# Patient Record
Sex: Female | Born: 1960 | Race: White | Hispanic: No | Marital: Married | Smoking: Former smoker
Health system: Southern US, Community
[De-identification: ages and names within clinical notes are randomized; demographics above are authoritative.]

## PROBLEM LIST (undated history)

## (undated) DIAGNOSIS — C801 Malignant (primary) neoplasm, unspecified: Secondary | ICD-10-CM

## (undated) DIAGNOSIS — Z973 Presence of spectacles and contact lenses: Secondary | ICD-10-CM

## (undated) DIAGNOSIS — F32A Depression, unspecified: Secondary | ICD-10-CM

## (undated) DIAGNOSIS — E785 Hyperlipidemia, unspecified: Secondary | ICD-10-CM

## (undated) DIAGNOSIS — I10 Essential (primary) hypertension: Secondary | ICD-10-CM

## (undated) DIAGNOSIS — I700 Atherosclerosis of aorta: Secondary | ICD-10-CM

## (undated) DIAGNOSIS — F329 Major depressive disorder, single episode, unspecified: Secondary | ICD-10-CM

## (undated) HISTORY — DX: Essential (primary) hypertension: I10

## (undated) HISTORY — PX: DILATION AND CURETTAGE OF UTERUS: SHX78

## (undated) HISTORY — PX: HAND EXPLORATION: SHX1725

## (undated) HISTORY — DX: Atherosclerosis of aorta: I70.0

## (undated) HISTORY — DX: Hyperlipidemia, unspecified: E78.5

---

## 1999-05-15 ENCOUNTER — Ambulatory Visit (HOSPITAL_COMMUNITY): Admission: RE | Admit: 1999-05-15 | Discharge: 1999-05-15 | Payer: Self-pay | Admitting: *Deleted

## 1999-05-15 ENCOUNTER — Encounter (INDEPENDENT_AMBULATORY_CARE_PROVIDER_SITE_OTHER): Payer: Self-pay

## 1999-09-23 HISTORY — PX: MASTECTOMY: SHX3

## 1999-09-23 HISTORY — PX: BREAST LUMPECTOMY WITH AXILLARY LYMPH NODE BIOPSY: SHX5593

## 1999-09-23 HISTORY — PX: BREAST RECONSTRUCTION: SHX9

## 2000-05-07 ENCOUNTER — Other Ambulatory Visit: Admission: RE | Admit: 2000-05-07 | Discharge: 2000-05-07 | Payer: Self-pay | Admitting: Obstetrics and Gynecology

## 2000-06-04 ENCOUNTER — Encounter: Admission: RE | Admit: 2000-06-04 | Discharge: 2000-06-04 | Payer: Self-pay | Admitting: *Deleted

## 2000-06-04 ENCOUNTER — Other Ambulatory Visit: Admission: RE | Admit: 2000-06-04 | Discharge: 2000-06-04 | Payer: Self-pay | Admitting: *Deleted

## 2000-06-04 ENCOUNTER — Encounter (INDEPENDENT_AMBULATORY_CARE_PROVIDER_SITE_OTHER): Payer: Self-pay | Admitting: *Deleted

## 2000-06-19 ENCOUNTER — Ambulatory Visit (HOSPITAL_BASED_OUTPATIENT_CLINIC_OR_DEPARTMENT_OTHER): Admission: RE | Admit: 2000-06-19 | Discharge: 2000-06-19 | Payer: Self-pay | Admitting: *Deleted

## 2000-06-30 ENCOUNTER — Encounter (INDEPENDENT_AMBULATORY_CARE_PROVIDER_SITE_OTHER): Payer: Self-pay | Admitting: Specialist

## 2000-06-30 ENCOUNTER — Ambulatory Visit (HOSPITAL_BASED_OUTPATIENT_CLINIC_OR_DEPARTMENT_OTHER): Admission: RE | Admit: 2000-06-30 | Discharge: 2000-06-30 | Payer: Self-pay | Admitting: *Deleted

## 2000-07-31 ENCOUNTER — Encounter (INDEPENDENT_AMBULATORY_CARE_PROVIDER_SITE_OTHER): Payer: Self-pay | Admitting: *Deleted

## 2000-07-31 ENCOUNTER — Inpatient Hospital Stay (HOSPITAL_COMMUNITY): Admission: RE | Admit: 2000-07-31 | Discharge: 2000-08-03 | Payer: Self-pay | Admitting: *Deleted

## 2000-09-22 HISTORY — PX: BREAST RECONSTRUCTION: SHX9

## 2000-12-29 ENCOUNTER — Encounter: Admission: RE | Admit: 2000-12-29 | Discharge: 2000-12-29 | Payer: Self-pay | Admitting: *Deleted

## 2000-12-29 ENCOUNTER — Encounter: Payer: Self-pay | Admitting: *Deleted

## 2001-08-04 ENCOUNTER — Other Ambulatory Visit: Admission: RE | Admit: 2001-08-04 | Discharge: 2001-08-04 | Payer: Self-pay | Admitting: Obstetrics and Gynecology

## 2001-09-03 ENCOUNTER — Encounter (INDEPENDENT_AMBULATORY_CARE_PROVIDER_SITE_OTHER): Payer: Self-pay | Admitting: *Deleted

## 2001-09-03 ENCOUNTER — Ambulatory Visit (HOSPITAL_BASED_OUTPATIENT_CLINIC_OR_DEPARTMENT_OTHER): Admission: RE | Admit: 2001-09-03 | Discharge: 2001-09-04 | Payer: Self-pay | Admitting: Plastic Surgery

## 2001-09-22 HISTORY — PX: RECONSTRUCTION / CORRECTION OF NIPPLE / AEROLA: SUR1073

## 2001-12-08 ENCOUNTER — Ambulatory Visit (HOSPITAL_BASED_OUTPATIENT_CLINIC_OR_DEPARTMENT_OTHER): Admission: RE | Admit: 2001-12-08 | Discharge: 2001-12-08 | Payer: Self-pay | Admitting: Plastic Surgery

## 2002-03-15 ENCOUNTER — Encounter: Payer: Self-pay | Admitting: *Deleted

## 2002-03-15 ENCOUNTER — Encounter: Admission: RE | Admit: 2002-03-15 | Discharge: 2002-03-15 | Payer: Self-pay | Admitting: *Deleted

## 2002-09-22 HISTORY — PX: CYST EXCISION: SHX5701

## 2003-01-09 ENCOUNTER — Other Ambulatory Visit: Admission: RE | Admit: 2003-01-09 | Discharge: 2003-01-09 | Payer: Self-pay | Admitting: Obstetrics and Gynecology

## 2003-03-23 ENCOUNTER — Encounter (INDEPENDENT_AMBULATORY_CARE_PROVIDER_SITE_OTHER): Payer: Self-pay | Admitting: Specialist

## 2003-03-23 ENCOUNTER — Ambulatory Visit (HOSPITAL_BASED_OUTPATIENT_CLINIC_OR_DEPARTMENT_OTHER): Admission: RE | Admit: 2003-03-23 | Discharge: 2003-03-23 | Payer: Self-pay | Admitting: Plastic Surgery

## 2003-04-10 ENCOUNTER — Encounter: Payer: Self-pay | Admitting: Oncology

## 2003-04-10 ENCOUNTER — Encounter: Admission: RE | Admit: 2003-04-10 | Discharge: 2003-04-10 | Payer: Self-pay | Admitting: Oncology

## 2003-04-13 ENCOUNTER — Encounter: Admission: RE | Admit: 2003-04-13 | Discharge: 2003-04-13 | Payer: Self-pay | Admitting: Oncology

## 2003-04-13 ENCOUNTER — Encounter: Payer: Self-pay | Admitting: Oncology

## 2004-05-07 ENCOUNTER — Encounter: Admission: RE | Admit: 2004-05-07 | Discharge: 2004-05-07 | Payer: Self-pay | Admitting: Obstetrics and Gynecology

## 2004-05-15 ENCOUNTER — Other Ambulatory Visit: Admission: RE | Admit: 2004-05-15 | Discharge: 2004-05-15 | Payer: Self-pay | Admitting: Obstetrics and Gynecology

## 2005-02-10 ENCOUNTER — Ambulatory Visit: Payer: Self-pay | Admitting: Oncology

## 2005-05-07 ENCOUNTER — Encounter: Admission: RE | Admit: 2005-05-07 | Discharge: 2005-05-07 | Payer: Self-pay | Admitting: Oncology

## 2005-10-21 ENCOUNTER — Other Ambulatory Visit: Admission: RE | Admit: 2005-10-21 | Discharge: 2005-10-21 | Payer: Self-pay | Admitting: Obstetrics and Gynecology

## 2005-10-27 ENCOUNTER — Ambulatory Visit: Payer: Self-pay | Admitting: Oncology

## 2006-07-22 ENCOUNTER — Encounter: Admission: RE | Admit: 2006-07-22 | Discharge: 2006-07-22 | Payer: Self-pay | Admitting: Family Medicine

## 2006-08-24 ENCOUNTER — Encounter: Admission: RE | Admit: 2006-08-24 | Discharge: 2006-08-24 | Payer: Self-pay | Admitting: Obstetrics and Gynecology

## 2007-03-30 ENCOUNTER — Encounter: Admission: RE | Admit: 2007-03-30 | Discharge: 2007-03-30 | Payer: Self-pay | Admitting: Obstetrics and Gynecology

## 2007-07-26 ENCOUNTER — Encounter: Admission: RE | Admit: 2007-07-26 | Discharge: 2007-07-26 | Payer: Self-pay | Admitting: Family Medicine

## 2008-08-28 ENCOUNTER — Encounter: Admission: RE | Admit: 2008-08-28 | Discharge: 2008-08-28 | Payer: Self-pay | Admitting: Obstetrics and Gynecology

## 2008-09-22 HISTORY — PX: BILATERAL SALPINGOOPHORECTOMY: SHX1223

## 2008-09-22 HISTORY — PX: LAPAROSCOPIC VAGINAL HYSTERECTOMY: SUR798

## 2009-07-17 ENCOUNTER — Ambulatory Visit (HOSPITAL_COMMUNITY): Admission: RE | Admit: 2009-07-17 | Discharge: 2009-07-18 | Payer: Self-pay | Admitting: Obstetrics and Gynecology

## 2009-07-17 ENCOUNTER — Encounter (INDEPENDENT_AMBULATORY_CARE_PROVIDER_SITE_OTHER): Payer: Self-pay | Admitting: Obstetrics and Gynecology

## 2009-08-29 ENCOUNTER — Encounter: Admission: RE | Admit: 2009-08-29 | Discharge: 2009-08-29 | Payer: Self-pay | Admitting: Family Medicine

## 2010-01-06 IMAGING — MG MM DIGITAL SCREENING
3 series · 3 of 3 positions shown · non-contrast
Comparison: none

DG SCREEN MAMMOGRAM BILATERAL
Bilateral CC and MLO view(s) were taken.

DIGITAL SCREENING MAMMOGRAM WITH CAD:
There are scattered fibroglandular densities.  Right TRAM flap is present.  No masses or malignant 
type calcifications are identified.  Compared with prior studies.
Images were processed with CAD.

[L CC]
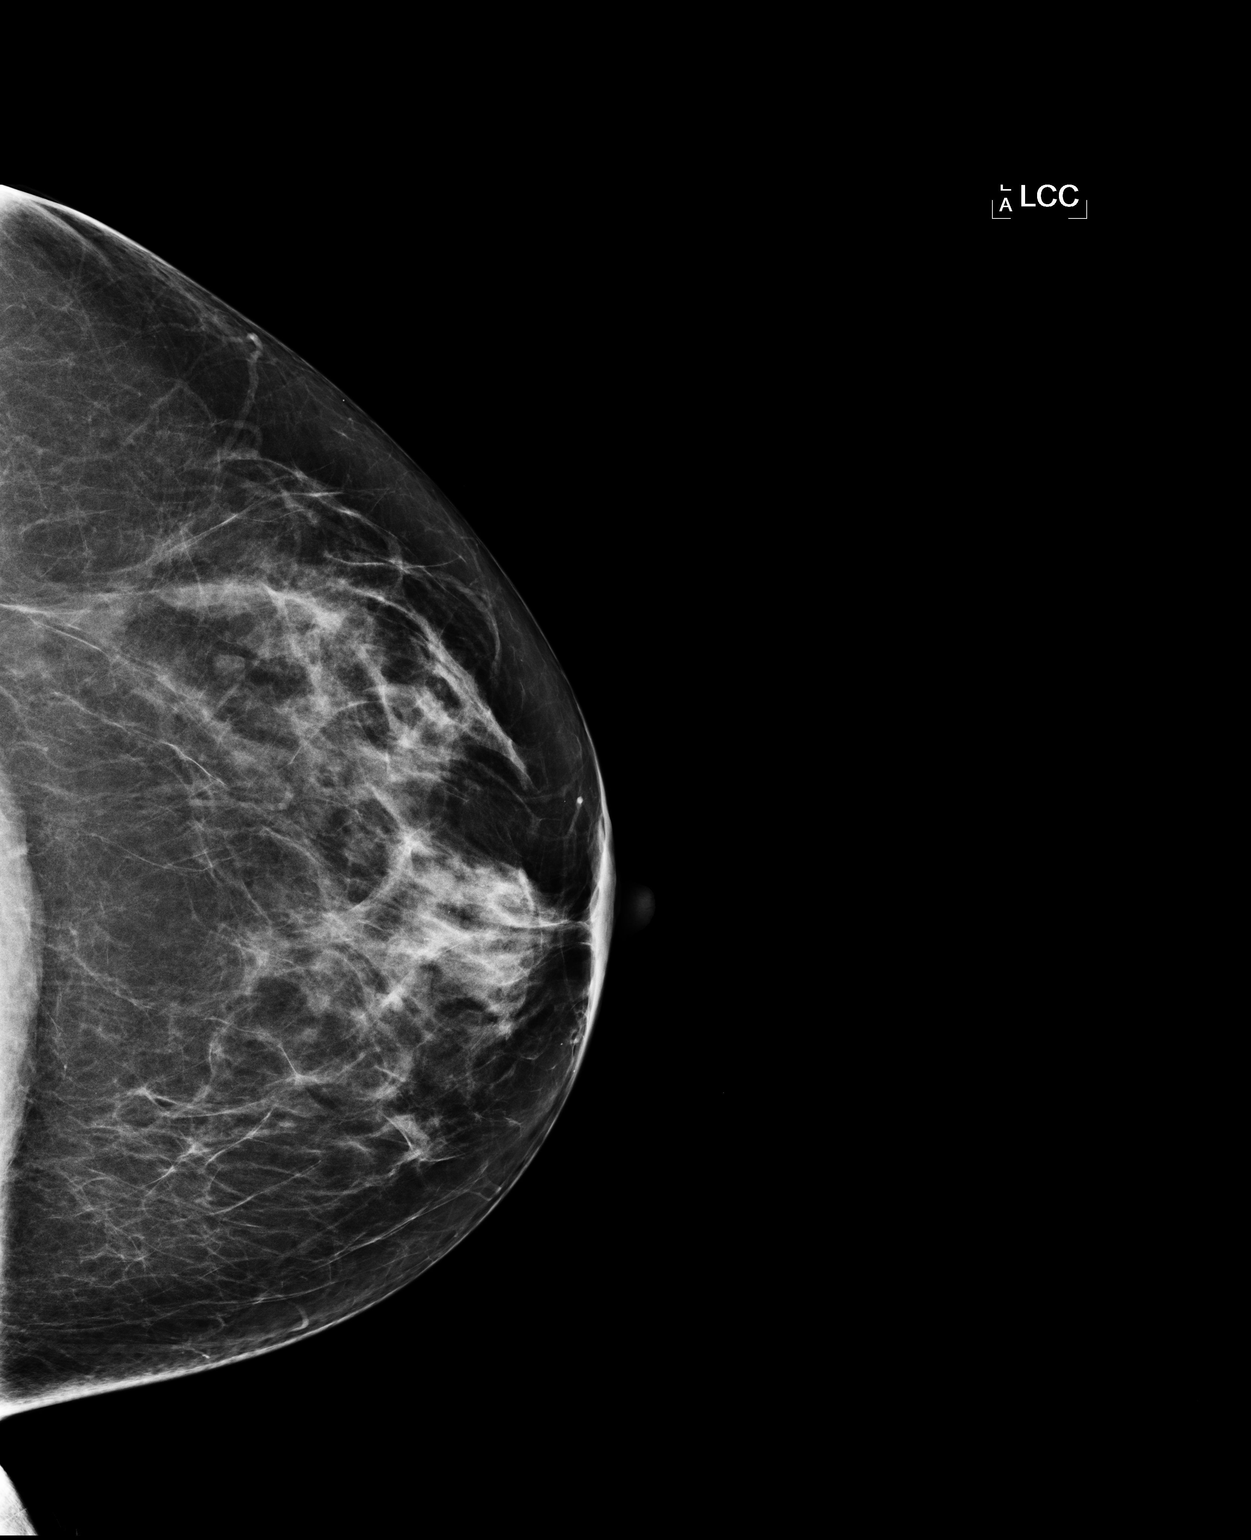

[L MLO]
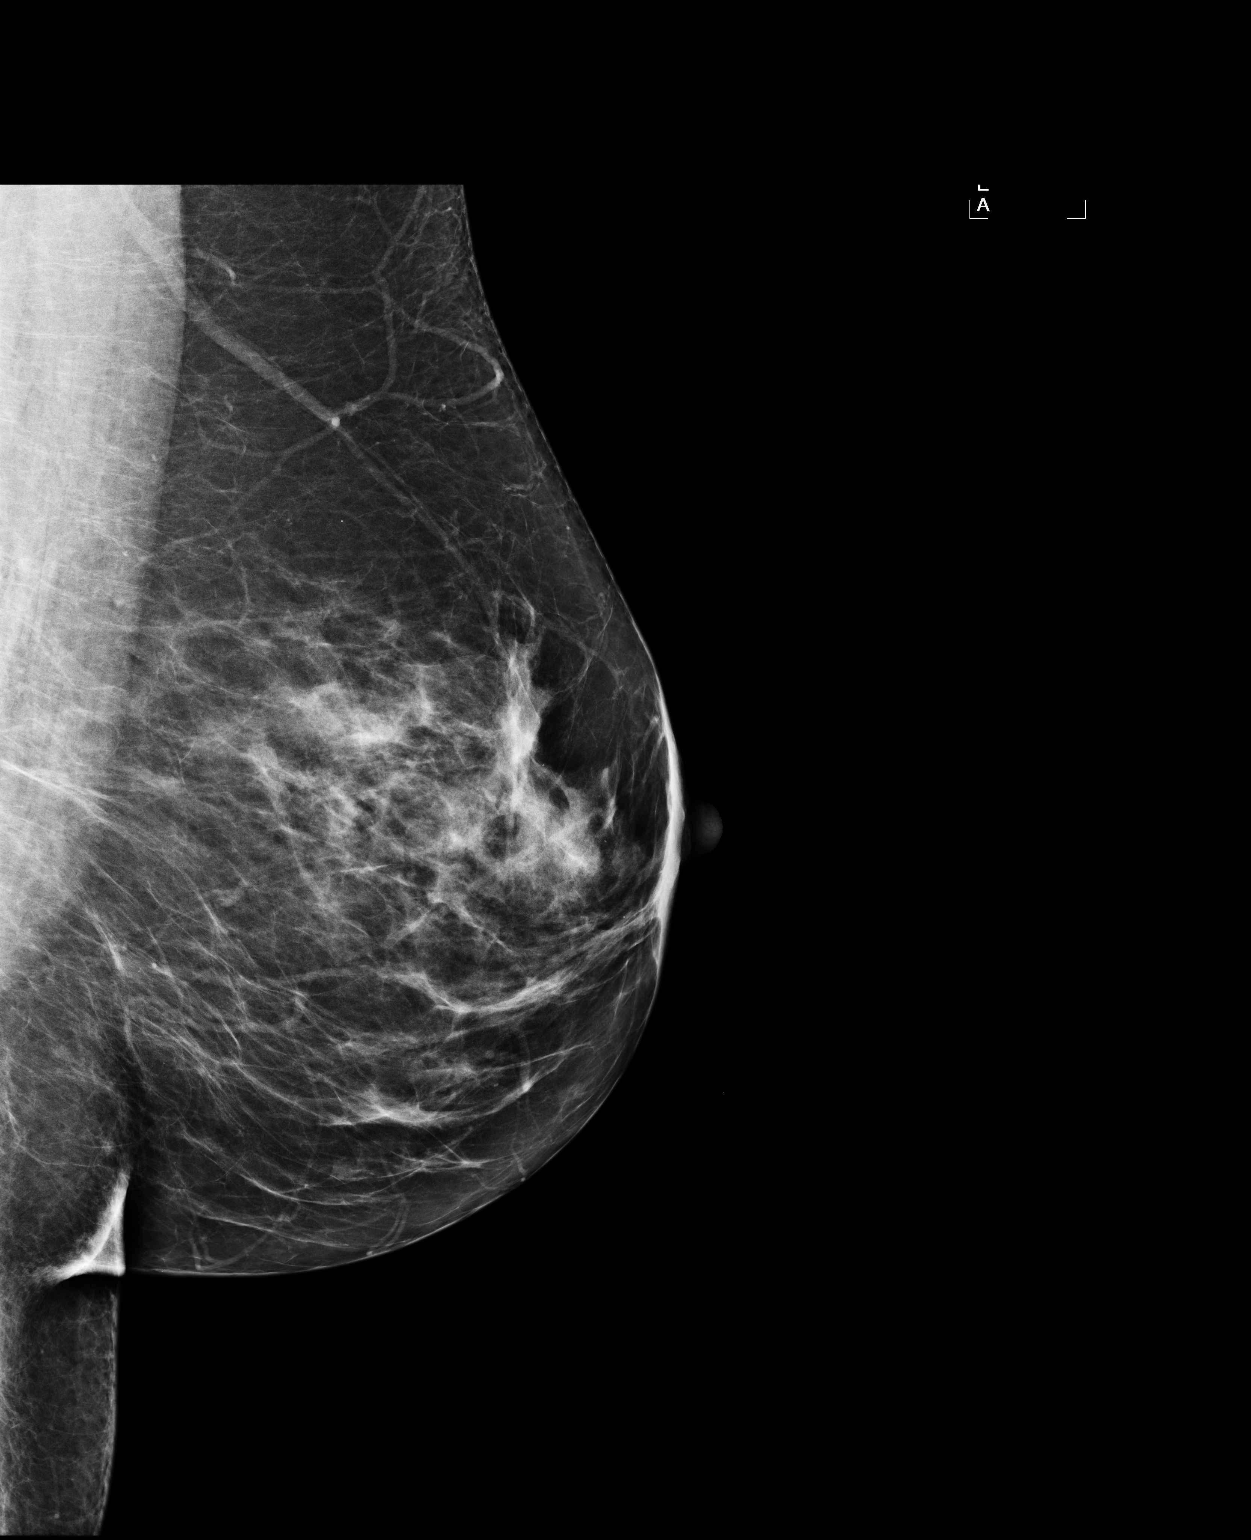

[R MLO]
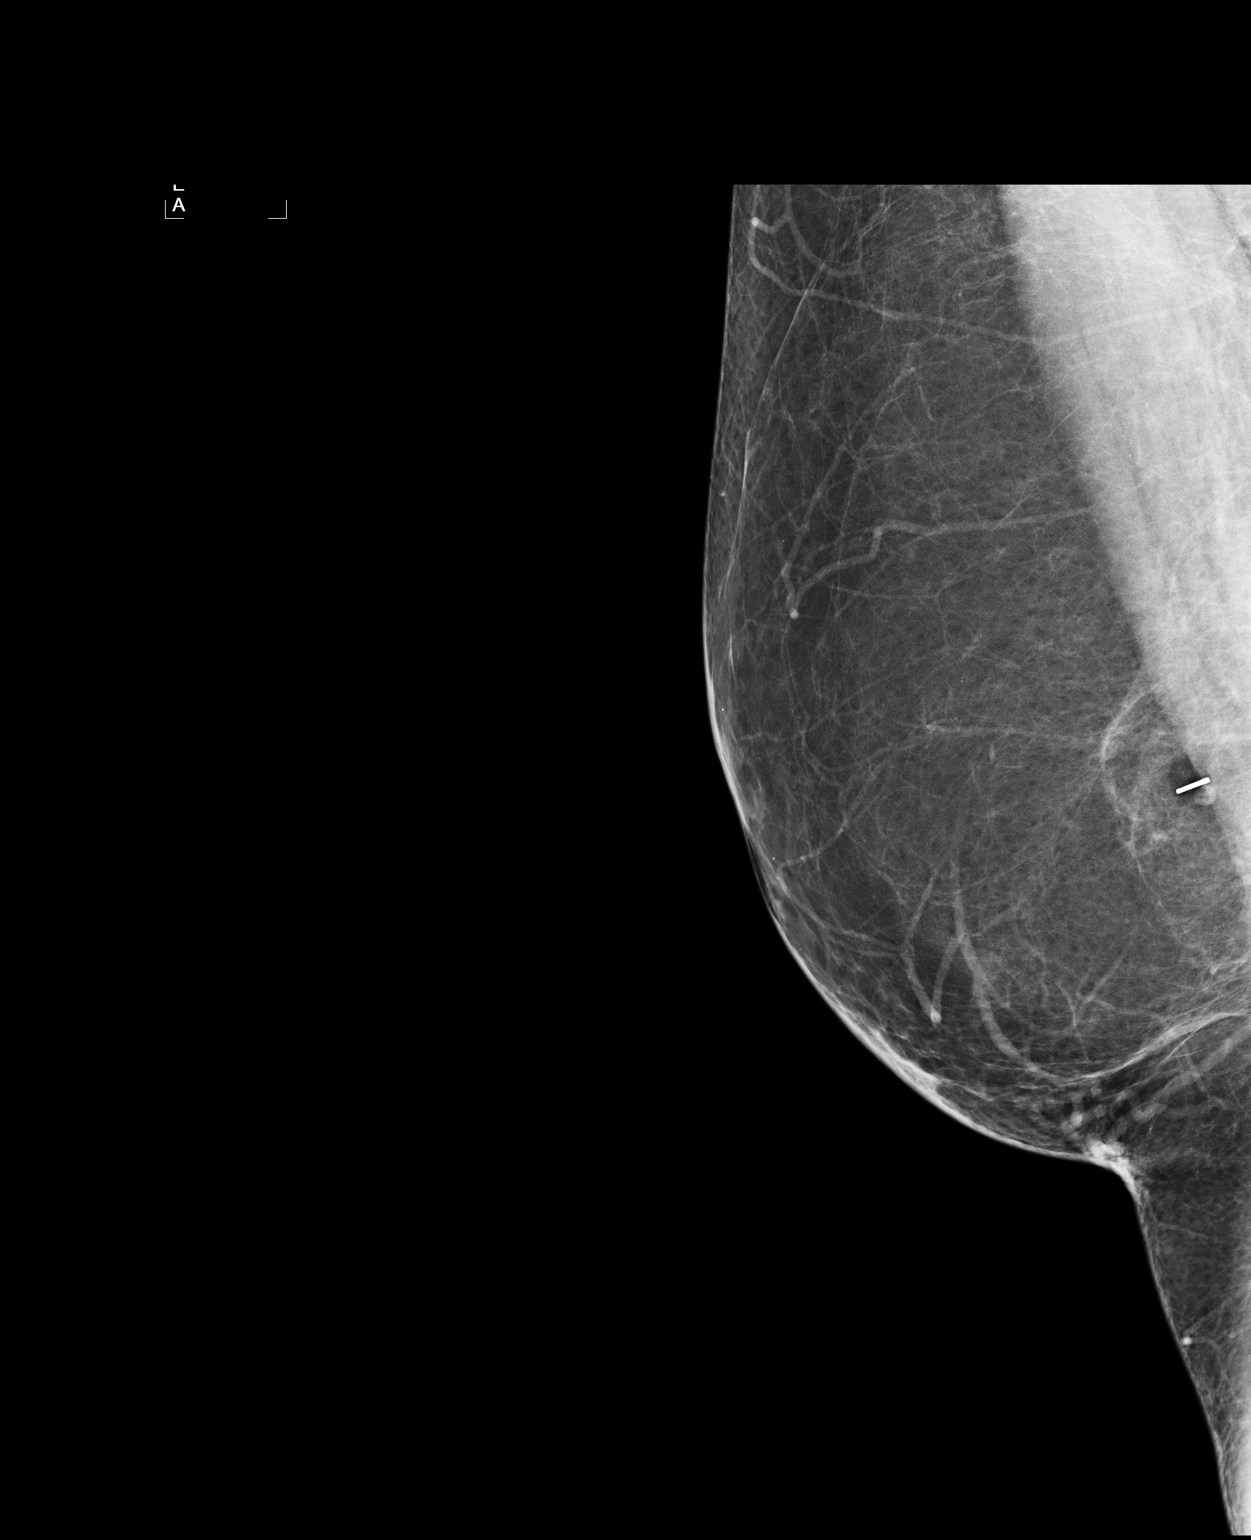

[3 of 3 positions shown; findings below may reference images not displayed]

IMPRESSION: No specific mammographic evidence of malignancy.  Next screening mammogram is recommended in one 
year.

A result letter of this screening mammogram will be mailed directly to the patient.

ASSESSMENT: Negative - BI-RADS 1

Screening mammogram in 1 year.
,

## 2010-09-12 ENCOUNTER — Encounter
Admission: RE | Admit: 2010-09-12 | Discharge: 2010-09-12 | Payer: Self-pay | Source: Home / Self Care | Attending: Obstetrics and Gynecology | Admitting: Obstetrics and Gynecology

## 2010-12-26 LAB — COMPREHENSIVE METABOLIC PANEL
AST: 24 U/L (ref 0–37)
Albumin: 4.3 g/dL (ref 3.5–5.2)
Alkaline Phosphatase: 51 U/L (ref 39–117)
CO2: 28 mEq/L (ref 19–32)
Creatinine, Ser: 0.74 mg/dL (ref 0.4–1.2)
GFR calc non Af Amer: >60 mL/min (ref >60)
Glucose, Bld: 69 mg/dL — ABNORMAL LOW (ref 70–99)
Potassium: 3.9 mEq/L (ref 3.5–5.1)
Total Bilirubin: 0.7 mg/dL (ref 0.3–1.2)

## 2010-12-26 LAB — CBC
HCT: 45.2 % (ref 36.0–46.0)
Hemoglobin: 11.4 g/dL — ABNORMAL LOW (ref 12.0–15.0)
Hemoglobin: 15.2 g/dL — ABNORMAL HIGH (ref 12.0–15.0)
MCHC: 33.6 g/dL (ref 30.0–36.0)
MCV: 93.5 fL (ref 78.0–100.0)
Platelets: 222 10*3/uL (ref 150–400)
Platelets: 270 10*3/uL (ref 150–400)
RBC: 3.61 MIL/uL — ABNORMAL LOW (ref 3.87–5.11)
RBC: 4.84 MIL/uL (ref 3.87–5.11)
RDW: 12.5 % (ref 11.5–15.5)
WBC: 12 10*3/uL — ABNORMAL HIGH (ref 4.0–10.5)

## 2011-02-07 NOTE — Op Note (Signed)
[place]. [facility]  Patient:    Whitney [name], Whitney [name] Visit Number: [id number] MRN: [id number]          Service Type: DSU Location: [facility] Attending Physician:  Loura Halt Ii Dictated by:   Alfredia Ferguson, M.D. Proc. Date: 09/03/01 Admit Date:  09/03/2001                             Operative Report  PREOPERATIVE DIAGNOSES: 1. History of breast cancer, status post right tram flap reconstruction of    right breast. 2. Fat necrosis of right tram flap. 3. Asymmetric breasts with left native breast larger than reconstructed    breast.  POSTOPERATIVE DIAGNOSES: 1. History of breast cancer, status post right tram flap reconstruction of    right breast. 2. Fat necrosis of right tram flap. 3. Asymmetric breasts with left native breast larger than reconstructed    breast.  OPERATION: 1. Revision of right reconstructed breast by removal of fat necrosis and    rotation of tram flap in a medial direction. 2. Left breast reduction, 102 g removed.  SURGEON:  Alfredia Ferguson, M.D.  ANESTHESIA:  General endotracheal anesthesia.  INDICATIONS FOR PROCEDURE:  This is a 51 year old woman who had right breast cancer and reconstruction with a tram flap approximately a year ago.  She developed medial tram flap fat necrosis which is approximately 12 cm in length and approximately 3 cm in width.  She wishes to have the right reconstructed breast revised.  My plan is to remove this large area of medial fat necrosis. In addition, I will elevate the tram completely and re-rotate the tram flap to a more medial direction to fill the space left by the excision of the fat necrosis.  The patient also has a left native breast which is significantly larger than the right breast.  This is also very totic.  Because of the asymmetry, the plan is to do a small reduction and elevate the left breast to a higher position to match the tram more closely.  The patient was  counseled regarding the potential risks of surgery, including damage to the tram flap due to interruption of its blood supply.  The patient also understands the risks of bleeding, infection, asymmetry, necrosis of the nipple areolar complex on the left side for the reduction, over reduction of the left side, under reduction of the left side, unsightly scarring, continued asymmetry, recurrent fat necrosis, and overall dissatisfaction with the procedures.  In spite of these and other risks discussed, the patient wishes to proceed with the operation.  DESCRIPTION OF PROCEDURE:  With the patient in a standing position and prior to being taken to the operating room, skin marks were placed on the left side outlining a wife skin pattern for inferior pedicle technique reduction mammoplasty.  In addition, the dimensions of the fat necrosis were marked on the right side.  The patient was taken to the operating room where she was given general laryngeal mask anesthesia.  The patients chest was prepped with Betadine and draped with sterile drapes.  Attention was first directed to the right reconstructed breast.  An incision was made in the original tram flap incision completely around the skin paddle of the tram flap.  The incision was extended medialward through the original mastectomy incision.  Superior breast flaps were elevated along with inferior breast flaps at the plane between the tram flap and the breast flaps.  Dissection was carried deeply and medially, skirting along the medial border of the fat necrosis.  Dissection was deepened until reaching the pectoralis fascia along the complete medial border of the tram flap.  The tram was now elevated off of the pectoralis fascia from a medial to lateral direction.  I encountered the rectus muscle approximately 5 to 6 cm into the dissection.  At that point, I deepened the dissection to get under the rectus muscle.  Once I had dissected the tram  flap off of the chest wall approximately halfway from medial to lateral, I opted to continue my dissection from lateral to medial.  The lateral limits of the tram were identified, and dissected off of the lateral chest wall and continued to dissect it off the pectoralis muscle until meeting my previous dissection. The rectus muscle was protected throughout the entire dissection, and the color of the tram flap remained pink and healthy.  The tram flap now easily rotated in a more medial direction in an adequate amount of distance to fill the space that would be left after I dissected the fat necrosis.  The fat necrosis was now dissected away from the soft tram tissue using electrocautery.  The fat necrosis once completely dissected, was passed off for pathology.  Hemostasis was accomplished using electrocautery along the edge of the medial tram flap.  The mastectomy pocket which has essentially been recreated by my dissection was copiously irrigated with saline irrigation, and hemostasis accomplished using electrocautery.  A 10 mm Blake drain was placed through a separate stab incision and placed in the lateral axillary gutter.  The superior medial and medial border of the tram flap was now rotated into the more medial position of the mastectomy pocket, and fixed into position using multiple 3-0 Vicryl sutures to suspend it from its new desired position.  This filled the space very nicely, and with no tension whatsoever left behind from the fat necrosis excision.  Temporary closure of the mastectomy incision was carried out using staples.  I was pleasantly surprised to find that I was able to close the mastectomy incision primarily, thus allowing me to completely de-epithelialize the skin paddle of the tram flap.  Temporary staples were placed in the mastectomy incision to ensure that there was no tension.  Attention was now directed to the left breast where a reduction mammoplasty was  commenced.  An 8 cm wide inferiorly based pedicle was marked around the central portion of the breast, and within the confines  of this pedicle the nipple areolar complex remained.  A 42 mm diameter circle was drawn around the nipple.  The mark around the nipple was now incised, and the dimensions of the pedicle were also incised, and the pedicle was de-epithelialized with the exception of the nipple areolar complex.  The pedicle was dissected away from the surrounding breast tissue, cutting on the bias, both in a medial, superior, and lateral direction to ensure adequate connections to the chest wall for vascular integrity.  Once the pedicle had been dissected away from the surrounding breast tissue, the remainder of the previously marked skin marks were incised.  The medial breast flap was incised and deepened until reaching the chest wall.  The medial breast flap was dissected off the pectoralis flap for several centimeters.  The central breast flap was now incised at the location of the new nipple areolar complex, and the central breast flap was also dissected, cutting on the bias towards the chest wall in a  superior direction.  The lateral breast flap was likewise incised, and cut down to the chest wall thinning the breast flap laterally to the thickness of approximately 3 to 3.5 cm.  Once the medial, superior, and lateral breast flaps had been elevated, this left the excess dermaglandular tissue between the pedicle and the breast flaps still connected to the chest wall.  This excess tissue was now dissected off of the chest wall from a medial to lateral direction, and then passed off for pathology.  A total of 102 g were removed from the breast.  Hemostasis was accomplished throughout the dissection using electrocautery.  The breast reduction wound was copiously irrigated with warm saline irrigation, and again inspected for hemostasis. Once hemostasis had been assured, closure was  commenced.  The inferior corner of the vertical limb of the medial and lateral breast flaps were united to the central portion of the inframammary crease incision using 2-0 Vicryl suture. The superior corner of the vertical limb was united in a similar suture.  The nipple areolar complex was placed in its new position and fixed in position using multiple interrupted 3-0 Vicryl sutures for the dermis.  The inframammary crease incision was closed using multiple 3-0 Vicryl sutures for the dermis.  The nipple areolar complex and vertical limb of the wound were closed using a running 4-0 Monocryl subcuticular suture.  The reduced breast was cleansed, dried, and Steri-Strips were applied.  Attention was redirected to the right breast.  There were no excess tension noted on my temporary closure with staples.  All staples were removed, and the skin from the tram flap was now de-epithelialized.  The pocket of the tram side was now copiously irrigated with warm saline irrigation, and inspected once more for hemostasis. Once assured, the transverse original mastectomy incision was closed using multiple interrupted 3-0 Vicryl sutures in the dermis.  There was a lateral dog ear which I excised.  After excision, hemostasis was accomplished in the cut edge, and the lateral corner was then closed.  The right side was cleansed, dried, and Steri-Strips were applied to the incision.  Asymmetry was much improved.  The right tram was nice and soft.  Bulky dressings were placed around both left and right side, and a circumferential wrap with a 6 inch Ace was placed.  The patient tolerated the procedure well, with an estimated blood loss of approximately 100 cc.  She was awakened, extubated, and transported to the recovery room in satisfactory condition. Dictated by:   Alfredia Ferguson, M.D. Attending Physician:  Loura Halt Ii DD:  09/03/01 TD:  09/03/01 Job: [id number]

## 2011-02-07 NOTE — Op Note (Signed)
[place]. [facility]  Patient:    Whitney [name], Whitney [name]                      MRN: [id number] Proc. Date: 06/30/00 Adm. Date:  [id number] Attending:  Stephenie Acres                           Operative Report  PREOPERATIVE DIAGNOSIS:  Right breast cancer.  POSTOPERATIVE DIAGNOSIS:  Right breast cancer.  OPERATION: 1. Right partial mastectomy. 2. Blue dye injection. 3. Lymphatic mapping. 4. Sentinel lymph node biopsy.  SURGEON:  Catalina Lunger, M.D.  ANESTHESIA:  General  DESCRIPTION OF PROCEDURE:  The patient was taken to the operating room and placed in the supine position.  After adequate anesthesia was induced, the right breast and axilla were prepped and draped in the normal sterile fashion. Then 5 cc of Lymphazurin blue dye were injected intradermally around the nipple and around the previous biopsy site.  This was massaged for five minutes.  Using the neoprobe, an area of high activity was identified in the right axilla.  Small incision was made over this, dissected down to subcutaneous fat until a hot blue lymph node was identified.  This was excised and sent for pathologic evaluation.  Touch prep was negative for malignant cells.  The skin was closed with subcuticular 4-0 Monocryl. I then turned my attention to the previous lumpectomy site.  An elliptical incision was made around the previous scar, dissected in all directions down to the pectoralis fascia excising a very large segment of breast tissue.  The margins were marked and was sent for pathologic evaluation. Skin was closed with subcuticular 4-0 Monocryl.  Steri-Strips and sterile dressing were applied. The patient tolerated the procedure well and went to PACU in good condition. DD:  06/30/00 TD:  07/01/00 Job: [id number]

## 2011-02-07 NOTE — Op Note (Signed)
[place]. [facility]  Patient:    Whitney [name], Whitney [name] Visit Number: [id number] MRN: [id number]          Service Type: DSU Location: [facility] Attending Physician:  Loura Halt Ii Dictated by:   Alfredia Ferguson, M.D. Proc. Date: 12/08/01 Admit Date:  12/08/2001                             Operative Report  PREOPERATIVE DIAGNOSIS:  Right breast cancer.  Acquired absence of right breast.  Status post right breast reconstruction with TRAM flap.  POSTOPERATIVE DIAGNOSIS:  Right breast cancer.  Acquired absence of right breast.  Status post right breast reconstruction with TRAM flap.  PROCEDURE:  Right nipple reconstruction using double opposing tab technique.  SURGEON:  Alfredia Ferguson, M.D.  ANESTHESIA:  1% Xylocaine plain.  INDICATIONS:  This is a 50 year old woman who is status post right mastectomy for breast cancer.  She was reconstructed with a buried TRAM flap.  She now comes in for nipple reconstruction.  The plan is to do a double opposing tab technique utilizing the transverse mastectomy scar for reconstruction.  DESCRIPTION OF PROCEDURE:  Location of the nipple was marked with the patient in a sitting position.  The patient was placed in the supine position and a 42 mm diameter circle was drawn around the location of the marked nipple.  Within the confines of this circle, a lazy S-shaped skin mark was placed with the long axis of the S using the transverse scar of the mastectomy incision.  At either end of the S, a skin mark was placed outlining an half ellipse to be used for elevating a tab of full-thickness skin.  The area was prepped and draped in the usual sterile fashion.  Plain local anesthesia was infiltrated into the area.  The tab of full-thickness skin to be elevated was at the 6 oclock and 9 oclock position.  Both of these tabs were elevated to the end of the S skin mark both superiorly and inferiorly.  The S was now incised  down to the level of the subcutaneous tissue.  A full-thickness skin flap was elevated, first from the medial side with the inferior skin flap still attached and then the superior limb of the S was elevated from lateral to medial with the full-thickness skin stab connected to the flap.  Two tabs were elevated toward the middle and then folded together as two hands in prayer in order to create a cylinder.  This cylinder was fixed together with 4-0 chromic suture.  The tabs of skin were at the base of this created cylinder and was wrapped around the base and fixed to the cylinder using interrupted 4-0 chromic suture.  The donor site was closed using interrupted 4-0 PDS.  This was placed in the dermis.  The skin edges were closed using a running 4-0 chromic suture.  The patient tolerated the procedure well.  Vascularity of the nipple appeared to be good.  The area was cleansed and a bulky dressing was placed to protect the nipple.  The patient was discharged home in satisfactory condition. Dictated by:   Alfredia Ferguson, M.D. Attending Physician:  Loura Halt Ii DD:  12/08/01 TD:  12/09/01 Job: [id number]

## 2011-02-07 NOTE — Op Note (Signed)
   NAME:  Whitney [name], Whitney [name] NO.:  [id number]   MEDICAL RECORD NO.:  [id number]                   PATIENT TYPE:  AMB   LOCATION:                                       FACILITY:  [facility]   PHYSICIAN:  Alfredia Ferguson, M.D.               DATE OF BIRTH:  04-08-1961   DATE OF PROCEDURE:  03/23/2003  DATE OF DISCHARGE:                                 OPERATIVE REPORT   PREOPERATIVE DIAGNOSIS:  One cm sebaceous cyst, upper outer quadrant, right  reconstructed breast just below axilla.   POSTOPERATIVE DIAGNOSIS:  One cm sebaceous cyst, upper outer quadrant, right  reconstructed breast just below axilla.   OPERATION PERFORMED:  Excision of sebaceous cyst, upper outer quadrant,  right reconstructed breast.   SURGEON:  Alfredia Ferguson, M.D.   ANESTHESIA:  2% Xylocaine with 1:100,000 epinephrine.   INDICATIONS FOR PROCEDURE:  The patient is a 50 year old woman who  approximately six months ago had an infected sebaceous cyst which required  incision and drainage.  She now has a residual cyst.  She wished to have it  excised in order to prevent further infection.  She understands that she  will be trading what she has for a permanent potentially unsightly scar.  In  spite of that she wishes to proceed with the operation.   DESCRIPTION OF PROCEDURE:  Skin marks were placed in an elliptical fashion  around the cyst.  Local anesthesia was infiltrated and the area was prepped  with Betadine and draped with sterile drapes. After waiting approximately  five minutes, an elliptical excision of skin including the underlying cyst  was carried out.  The specimen was passed off to pathology. The wound edges  were undermined for a distance of several mm in all directions.  The wound  was closed by approximating the dermis using interrupted 4-0 Monocryl  suture.  Steri-Strips were applied to the skin edges.  A light dressing was  applied and the patient was discharged home in  satisfactory condition.                                               Alfredia Ferguson, M.D.    WBB/MEDQ  D:  03/23/2003  T:  03/23/2003  Job:  [id number]

## 2011-02-07 NOTE — Op Note (Signed)
[place]. [facility]  Patient:    Whitney [name], Whitney [name]                      MRN: [id number] Adm. Date:  [id number] Attending:  Stephenie Acres                           Operative Report  ______  fashioned.  Small wires were trimmed back.  Elliptical incision was made around the superior wire initially, dissected down all around the wire to a depth of about 3 cm.  It appeared at this point that I could take both specimens en block and, therefore, the incision was extended to the inferior wire as well.  All tissues surround both wire tips was removed completely en block.  Wires were marked.  Adequate hemostasis was ensured, and skin was closed with subcuticular 4-0 Monocryl.  Steri-Strips and sterile dressings were applied.  The patient tolerated the procedure well and went to PACU in good condition. DD:  06/19/00 TD:  06/19/00 Job: [id number]

## 2011-02-07 NOTE — Op Note (Signed)
[place]. [facility]  Patient:    Whitney [name], Whitney [name]                      MRN: [id number] Proc. Date: 07/31/00 Adm. Date:  [id number] Attending:  Stephenie Acres CC:         Catalina Lunger, M.D.   Operative Report  PREOPERATIVE DIAGNOSES: 1. Right breast carcinoma. 2. Acquired absence right breast.  POSTOPERATIVE DIAGNOSES: 1. Right breast carcinoma. 2. Acquired absence right breast.  OPERATION PERFORMED:  Immediate breast reconstruction utilizing a right-sided transverse rectus abdominis myocutaneous flap.  SURGEON:  Alfredia Ferguson, M.D.  FIRST ASSISTANT:  Janet Berlin. Dan Humphreys, M.D.  ANESTHESIA:  General endotracheal anesthesia.  INDICATIONS:  This is a 50 year old female with biopsy-proven right breast carcinoma.  She has opted to undergo a right total mastectomy.  She wishes to then to proceed with immediate breast reconstruction with a TRAM flap.  She understands the potential risk of the surgery, including partial or complete loss of flap due to vascular compromise, asymmetry, unsightly scarring, infection, bleeding requiring transfusion, abdominal wound healing difficulties, including hernias, bulges or weakness, unsightly scarring in the abdomen, prolonged serous drainage and overall dissatisfaction with the results.  Despite of these and other risks discussed, the patient wished to proceed with the surgery.  DESCRIPTION OF PROCEDURE:  On the day prior to surgery, skin marks were placed outlining the dimensions of the skin paddle of the TRAM flap.  The patient was taken to the operating room, where Dr. Luan Pulling completed a total mastectomy.  Following the mastectomy, I was summoned to the operating room. The mastectomy defect was first inspected and hemostasis was ensured.  A 10 mm Blake drain was placed in the right axillary gutter and brought out through a separate stab incision.  A circular incision was now made around the  umbilicus and the umbilical stalk was dissected away from the abdominal flap leaving a healthy cuff of fatty tissue to ensure vascular integrity.  The superior portion of the skin paddle of the TRAM flap was now made and deepened until reaching the anterior abdominal wall fascia.  The abdominal flap was elevated to the costal margins bilaterally and the xiphoid in the midline.  A tunnel was continued on the medial right side of the costal margins until connecting with the inferomedial portion of the mastectomy dissection.  Tunnel was approximately 8 cm in width.  Hemostasis was accomplished using electrocautery.   The lower skin paddle incision was made and deepened until reaching the anterior abdominal wall fascia.  The left corner of the skin paddle was elevated off the anterior abdominal wall fascia until crossing 1 cm to the right of the linea alba.  The right corner of the skin paddle was now elevated until reaching approximately 3 cm medial to the lateral border of the rectus fascia.  The lateral rectus perforators were left intact.  Two parallel anterior rectus fascia incisions were made beginning at the costal margins on the right side and carrying the two parallel incisions inferiorly until reaching the skin paddles connection to the anterior rectus fascia on the right side.  The incisions were approximately 2.5 cm apart.  The medial incision was continued inferiorly skirting along the medial connection of the skin paddle to the anterior rectus fascia, then turning laterally up the inferior connection of the skin paddles connection.  The lateral rectus incision was continued inferiorly skirting along the lateral connection of the skin  paddle to the anterior rectus fascia until connecting with my previously made incision inferiorly.  The anterior rectus fascia was now dissected off the anterior portion of the muscle from the costal margins down to below the arcuate line.  The  central rectus strip was left intact.  The deep portion of the rectus muscle was now dissected out of its anatomic bed using bipolar dissection.  The deep inferior epigastric vessels were now visualized.  The rectus muscle was divided at the level of the arcuate line.  The mastectomy pocket was again inspected for hemostasis and irrigated once more.  The deep inferior epigastric artery was clipped with hemoclips and divided between the clips.  The two veins were also clipped and divided.  The zone four of the skin paddle (left lateral one-third) was now excised.  The skin paddle along with its connected rectus muscle was tunneled up into the mastectomy defect and temporarily stapled in position.  The abdominal wound was copiously irrigated with saline irrigation and inspected for hemostasis.  Once hemostasis had been ensured, the anterior rectus fascia was closed using multiple interrupted, buried figure-of-eight 0 Prolene sutures.  The rectus fascia closed without any tension.  Patient was noted preoperatively to have umbilical hernia.  I opted not to repair the umbilical hernia in order to not risk jeopardizing the vascular integrity of the umbilicus.  A 6 inch x 6 inch piece of Marlex mesh was placed over the rectus closure and trimmed to be approximately 4 inches in width and about 8 inches in height.  The mesh was oriented where the two points of the square were up and superior and inferior over the rectus fascia closure.  This mesh was fixed in position using a running 2-0 Prolene suture along the periphery.  An opening was made in the central Marlex mesh and the umbilicus was brought through this opening.  The abdominal wound was again copiously irrigated with saline irrigation and inspected for hemostasis.  Patient was now placed in a semi-Fowlers position with the head elevated approximately 30 degrees and the knees flexed.  The midline of the abdominal wound was closed using  interrupted 2-0 Vicryl suture for the dermis.  A new opening was made for the umbilicus and the umbilical  stalk was brought through this new opening and fixed in position by uniting the dermis of the umbilicus to the dermis of the abdominal flap using multiple interrupted 3-0 Vicryl sutures.  The remainder of the abdominal wound was closed using multiple interrupted 2-0 Vicryl sutures for the dermis.  The lower abdomen was cleansed, dried and Steri-Strips were applied to the abdominal wound and the umbilicus.  Attention was directed to the TRAM flap. The right lateral corner of the skin paddle was removed, excising approximately 3-4 cm of the right lateral corner.  There was excellent bleeding coming from the right lateral corner and no further excision of this tissue was needed.  I opted to remove several cm more of the left side of the skin paddle, which was medial on the chest wall in order to ensure better circulation.  There was some venous congestion present medially.  Once I debrided a bit more and I was secure about the amount of bleeding, I opted to begin insetting the flap.  The flap was pushed up into the superior limits of the mastectomy defect and the superior breast flap was allowed to overlap the skin paddle of the TRAM.  This area was marked and the skin  that was beneath the superior breast flap was deepithelialized.  The superior edges of the deepithelialized skin paddle was suspended from the superior limits of the mastectomy dissection using interrupted 3-0 Vicryl sutures.  Superior cut edge of the TRAM skin paddle was united with the cut edge of the superior breast flap using interrupted 3-0 Vicryl sutures for the dermis and a running 3-0 Vicryl subcuticular.  Inferiorly, the breast flap was allowed to overlap the skin paddle.  An additional amount of skin was deepithelialized in the inferior portion of the skin paddle.  I was able to get the medial 3-4 cm of the  mastectomy incision closed primarily with the medial TRAM flap buried beneath the closure.  Following further deepithelialization of the inferior TRAM skin paddle, the lower breast flap was closed to the skin paddle.  Vicryl 3-0 interrupted were used for the dermis.  The skin paddle had excellent capillary refill.  The chest was cleansed, dried and Steri-Strips were applied to the TRAM flap skin closure.  Two Blake drains were placed in the abdominal wound and brought out through separate stab incisions prior to closure of the abdominal wound.  Light dressings were applied to the abdomen and breast.  The patient was awakened, extubated and transported to the recovery room in satisfactory condition. DD:  07/31/00 TD:  08/01/00 Job: [id number]

## 2011-02-07 NOTE — Op Note (Signed)
[place]. [facility]  Patient:    Whitney [name], Whitney [name]                      MRN: [id number] Proc. Date: 07/31/00 Adm. Date:  [id number] Attending:  Stephenie Acres                           Operative Report  PREOPERATIVE DIAGNOSES: 1. Extensive ductal carcinoma in situ. 2. 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  POSTOPERATIVE DIAGNOSES: 1. Extensive ductal carcinoma in situ. 2. 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  PROCEDURE:  Right total mastectomy.  SURGEON:  Catalina Lunger, M.D.  ASSISTANT:  Adolph Pollack, M.D.  ANESTHESIA:  General.  DESCRIPTION OF PROCEDURE:  The patient was taken to the operating room and placed in the supine position.  After general anesthesia was induced by endotracheal tube, the bilateral chest, abdomen, and groins were prepped and draped in the normal sterile fashion.  Using an elliptical incision around the nipple/areolar complex of the right breast, extending out and around the previous lateral incision, I incised through the skin and dermis.  The superior flap was fashioned up to the clavicle laterally to the latissimus dorsi, inferiorly to the inframammary fold, and medially to the sternum.  All tissue was then removed en bloc off of the pectoralis fascia.  The specimen was sent to pathology.  Adequate hemostasis was assured.  At this point  Dr. _______ began a TRAM reconstruction. DD:  07/31/00 TD:  07/31/00 Job: [id number]

## 2011-08-06 ENCOUNTER — Other Ambulatory Visit: Payer: Self-pay | Admitting: Obstetrics and Gynecology

## 2011-08-06 DIAGNOSIS — Z1231 Encounter for screening mammogram for malignant neoplasm of breast: Secondary | ICD-10-CM

## 2011-09-29 ENCOUNTER — Ambulatory Visit
Admission: RE | Admit: 2011-09-29 | Discharge: 2011-09-29 | Disposition: A | Discharge status: Home / Self Care | Payer: BC Managed Care – PPO | Source: Ambulatory Visit | Attending: Obstetrics and Gynecology | Admitting: Obstetrics and Gynecology

## 2011-09-29 DIAGNOSIS — Z1231 Encounter for screening mammogram for malignant neoplasm of breast: Secondary | ICD-10-CM

## 2012-10-18 ENCOUNTER — Other Ambulatory Visit: Payer: Self-pay | Admitting: Obstetrics and Gynecology

## 2012-10-18 DIAGNOSIS — Z1231 Encounter for screening mammogram for malignant neoplasm of breast: Secondary | ICD-10-CM

## 2012-11-15 ENCOUNTER — Ambulatory Visit
Admission: RE | Admit: 2012-11-15 | Discharge: 2012-11-15 | Disposition: A | Discharge status: Home / Self Care | Payer: BC Managed Care – PPO | Source: Ambulatory Visit | Attending: Obstetrics and Gynecology | Admitting: Obstetrics and Gynecology

## 2012-11-15 DIAGNOSIS — Z1231 Encounter for screening mammogram for malignant neoplasm of breast: Secondary | ICD-10-CM

## 2013-10-31 ENCOUNTER — Encounter (HOSPITAL_BASED_OUTPATIENT_CLINIC_OR_DEPARTMENT_OTHER): Payer: Self-pay | Admitting: *Deleted

## 2013-10-31 ENCOUNTER — Other Ambulatory Visit: Payer: Self-pay | Admitting: Orthopedic Surgery

## 2013-10-31 NOTE — Progress Notes (Signed)
No labs needed

## 2013-11-02 ENCOUNTER — Ambulatory Visit (HOSPITAL_BASED_OUTPATIENT_CLINIC_OR_DEPARTMENT_OTHER)
Admission: RE | Admit: 2013-11-02 | Discharge: 2013-11-02 | Disposition: A | Discharge status: Home / Self Care | Payer: Managed Care, Other (non HMO) | Source: Ambulatory Visit | Attending: Orthopedic Surgery | Admitting: Orthopedic Surgery

## 2013-11-02 ENCOUNTER — Encounter (HOSPITAL_BASED_OUTPATIENT_CLINIC_OR_DEPARTMENT_OTHER)
Admission: RE | Disposition: A | Discharge status: Home / Self Care | Payer: Self-pay | Source: Ambulatory Visit | Attending: Orthopedic Surgery

## 2013-11-02 ENCOUNTER — Encounter (HOSPITAL_BASED_OUTPATIENT_CLINIC_OR_DEPARTMENT_OTHER): Payer: Managed Care, Other (non HMO) | Admitting: Anesthesiology

## 2013-11-02 ENCOUNTER — Encounter (HOSPITAL_BASED_OUTPATIENT_CLINIC_OR_DEPARTMENT_OTHER): Payer: Self-pay | Admitting: Orthopedic Surgery

## 2013-11-02 ENCOUNTER — Ambulatory Visit (HOSPITAL_BASED_OUTPATIENT_CLINIC_OR_DEPARTMENT_OTHER): Payer: Managed Care, Other (non HMO) | Admitting: Anesthesiology

## 2013-11-02 DIAGNOSIS — Z853 Personal history of malignant neoplasm of breast: Secondary | ICD-10-CM | POA: Insufficient documentation

## 2013-11-02 DIAGNOSIS — F3289 Other specified depressive episodes: Secondary | ICD-10-CM | POA: Insufficient documentation

## 2013-11-02 DIAGNOSIS — F329 Major depressive disorder, single episode, unspecified: Secondary | ICD-10-CM | POA: Insufficient documentation

## 2013-11-02 DIAGNOSIS — Z9010 Acquired absence of unspecified breast and nipple: Secondary | ICD-10-CM | POA: Insufficient documentation

## 2013-11-02 DIAGNOSIS — Z923 Personal history of irradiation: Secondary | ICD-10-CM | POA: Insufficient documentation

## 2013-11-02 DIAGNOSIS — F17200 Nicotine dependence, unspecified, uncomplicated: Secondary | ICD-10-CM | POA: Insufficient documentation

## 2013-11-02 DIAGNOSIS — Y9339 Activity, other involving climbing, rappelling and jumping off: Secondary | ICD-10-CM | POA: Insufficient documentation

## 2013-11-02 DIAGNOSIS — S63659A Sprain of metacarpophalangeal joint of unspecified finger, initial encounter: Secondary | ICD-10-CM | POA: Insufficient documentation

## 2013-11-02 DIAGNOSIS — X58XXXA Exposure to other specified factors, initial encounter: Secondary | ICD-10-CM | POA: Insufficient documentation

## 2013-11-02 HISTORY — DX: Presence of spectacles and contact lenses: Z97.3

## 2013-11-02 HISTORY — DX: Depression, unspecified: F32.A

## 2013-11-02 HISTORY — PX: LIGAMENT REPAIR: SHX5444

## 2013-11-02 HISTORY — DX: Major depressive disorder, single episode, unspecified: F32.9

## 2013-11-02 HISTORY — DX: Malignant (primary) neoplasm, unspecified: C80.1

## 2013-11-02 LAB — POCT HEMOGLOBIN-HEMACUE: HEMOGLOBIN: 15.1 g/dL — AB (ref 12.0–15.0)

## 2013-11-02 SURGERY — REPAIR, LIGAMENT
Anesthesia: Monitor Anesthesia Care | Site: Thumb | Laterality: Right

## 2013-11-02 MED ORDER — LIDOCAINE HCL (CARDIAC) 20 MG/ML IV SOLN
INTRAVENOUS | Status: DC | PRN
  Administered 2013-11-02: 50 mg via INTRAVENOUS

## 2013-11-02 MED ORDER — ONDANSETRON HCL 4 MG/2ML IJ SOLN
INTRAMUSCULAR | Status: DC | PRN
  Administered 2013-11-02: 4 mg via INTRAVENOUS

## 2013-11-02 MED ORDER — CHLORHEXIDINE GLUCONATE 4 % EX LIQD
60.0000 mL | Freq: Once | CUTANEOUS | Status: DC
Start: 2013-11-02 — End: 2013-11-02

## 2013-11-02 MED ORDER — HYDROMORPHONE HCL PF 1 MG/ML IJ SOLN: 0.2500 mg | INTRAMUSCULAR | Status: DC | PRN

## 2013-11-02 MED ORDER — FENTANYL CITRATE 0.05 MG/ML IJ SOLN
INTRAMUSCULAR | Status: AC
  Filled 2013-11-02: qty 4

## 2013-11-02 MED ORDER — LACTATED RINGERS IV SOLN
INTRAVENOUS | Status: DC
  Administered 2013-11-02 (×2): via INTRAVENOUS

## 2013-11-02 MED ORDER — HYDROCODONE-ACETAMINOPHEN 5-325 MG PO TABS
1.0000 | ORAL_TABLET | Freq: Four times a day (QID) | ORAL | Status: AC | PRN
Start: 2013-11-02 — End: ?

## 2013-11-02 MED ORDER — PROPOFOL INFUSION 10 MG/ML OPTIME
INTRAVENOUS | Status: DC | PRN
  Administered 2013-11-02: 100 ug/kg/min via INTRAVENOUS

## 2013-11-02 MED ORDER — CEFAZOLIN SODIUM-DEXTROSE 2-3 GM-% IV SOLR: 2.0000 g | INTRAVENOUS | Status: DC

## 2013-11-02 MED ORDER — MIDAZOLAM HCL 2 MG/2ML IJ SOLN
INTRAMUSCULAR | Status: AC
  Filled 2013-11-02: qty 2

## 2013-11-02 MED ORDER — OXYCODONE HCL 5 MG PO TABS: 5.0000 mg | ORAL_TABLET | Freq: Once | ORAL | Status: DC | PRN

## 2013-11-02 MED ORDER — FENTANYL CITRATE 0.05 MG/ML IJ SOLN
INTRAMUSCULAR | Status: AC
  Filled 2013-11-02: qty 2

## 2013-11-02 MED ORDER — OXYCODONE HCL 5 MG/5ML PO SOLN: 5.0000 mg | Freq: Once | ORAL | Status: DC | PRN

## 2013-11-02 MED ORDER — ONDANSETRON HCL 4 MG/2ML IJ SOLN: 4.0000 mg | Freq: Once | INTRAMUSCULAR | Status: DC | PRN

## 2013-11-02 MED ORDER — CEFAZOLIN SODIUM-DEXTROSE 2-3 GM-% IV SOLR
INTRAVENOUS | Status: AC
  Filled 2013-11-02: qty 50

## 2013-11-02 MED ORDER — FENTANYL CITRATE 0.05 MG/ML IJ SOLN
50.0000 ug | INTRAMUSCULAR | Status: DC | PRN
  Administered 2013-11-02: 100 ug via INTRAVENOUS

## 2013-11-02 MED ORDER — MIDAZOLAM HCL 2 MG/2ML IJ SOLN
1.0000 mg | INTRAMUSCULAR | Status: DC | PRN
  Administered 2013-11-02: 2 mg via INTRAVENOUS

## 2013-11-02 MED ORDER — BUPIVACAINE-EPINEPHRINE PF 0.5-1:200000 % IJ SOLN
INTRAMUSCULAR | Status: DC | PRN
  Administered 2013-11-02: 25 mL via PERINEURAL

## 2013-11-02 SURGICAL SUPPLY — 73 items
ANCH SUT 3-0 MN 1 LD NDL (Anchor) ×1 IMPLANT
ANCHOR JUGGERKNOT 1.0 3-0 NLD (Anchor) ×2 IMPLANT
BLADE MINI RND TIP GREEN BEAV (BLADE) ×3 IMPLANT
BLADE SURG 15 STRL LF DISP TIS (BLADE) ×1 IMPLANT
BLADE SURG 15 STRL SS (BLADE) ×3
BNDG CMPR 9X4 STRL LF SNTH (GAUZE/BANDAGES/DRESSINGS) ×1
BNDG COHESIVE 3X5 TAN STRL LF (GAUZE/BANDAGES/DRESSINGS) ×3 IMPLANT
BNDG ESMARK 4X9 LF (GAUZE/BANDAGES/DRESSINGS) ×3 IMPLANT
BNDG GAUZE ELAST 4 BULKY (GAUZE/BANDAGES/DRESSINGS) ×3 IMPLANT
CATH IV 18G (IV SOLUTION) IMPLANT
CHLORAPREP W/TINT 26ML (MISCELLANEOUS) ×3 IMPLANT
CORDS BIPOLAR (ELECTRODE) ×3 IMPLANT
COVER MAYO STAND STRL (DRAPES) ×3 IMPLANT
COVER TABLE BACK 60X90 (DRAPES) ×3 IMPLANT
CUFF TOURNIQUET SINGLE 18IN (TOURNIQUET CUFF) ×2 IMPLANT
DECANTER SPIKE VIAL GLASS SM (MISCELLANEOUS) IMPLANT
DRAPE EXTREMITY T 121X128X90 (DRAPE) ×3 IMPLANT
DRAPE OEC MINIVIEW 54X84 (DRAPES) ×2 IMPLANT
DRAPE SURG 17X23 STRL (DRAPES) ×3 IMPLANT
GAUZE XEROFORM 1X8 LF (GAUZE/BANDAGES/DRESSINGS) ×3 IMPLANT
GLOVE BIOGEL PI IND STRL 7.0 (GLOVE) IMPLANT
GLOVE BIOGEL PI IND STRL 8.5 (GLOVE) ×1 IMPLANT
GLOVE BIOGEL PI INDICATOR 7.0 (GLOVE) ×4
GLOVE BIOGEL PI INDICATOR 8.5 (GLOVE) ×2
GLOVE ECLIPSE 6.5 STRL STRAW (GLOVE) ×4 IMPLANT
GLOVE EXAM NITRILE LRG STRL (GLOVE) ×2 IMPLANT
GLOVE SURG ORTHO 8.0 STRL STRW (GLOVE) ×3 IMPLANT
GOWN STRL REUS W/ TWL LRG LVL3 (GOWN DISPOSABLE) ×1 IMPLANT
GOWN STRL REUS W/TWL LRG LVL3 (GOWN DISPOSABLE) ×6
GOWN STRL REUS W/TWL XL LVL3 (GOWN DISPOSABLE) ×3 IMPLANT
NDL ADDISON D1/2 CIR (NEEDLE) IMPLANT
NDL KEITH (NEEDLE) IMPLANT
NDL KEITH SZ10 STRAIGHT (NEEDLE) IMPLANT
NEEDLE 27GAX1X1/2 (NEEDLE) IMPLANT
NEEDLE ADDISON D1/2 CIR (NEEDLE) IMPLANT
NEEDLE FISTULA 1/2 CIRCLE (NEEDLE) IMPLANT
NEEDLE KEITH (NEEDLE) IMPLANT
NEEDLE KEITH SZ10 STRAIGHT (NEEDLE) IMPLANT
NS IRRIG 1000ML POUR BTL (IV SOLUTION) ×3 IMPLANT
PACK BASIN DAY SURGERY FS (CUSTOM PROCEDURE TRAY) ×3 IMPLANT
PAD CAST 3X4 CTTN HI CHSV (CAST SUPPLIES) ×1 IMPLANT
PADDING CAST ABS 4INX4YD NS (CAST SUPPLIES)
PADDING CAST ABS COTTON 4X4 ST (CAST SUPPLIES) ×1 IMPLANT
PADDING CAST COTTON 3X4 STRL (CAST SUPPLIES) ×3
PASSER SUT SWANSON 36MM LOOP (INSTRUMENTS) IMPLANT
SET EXT MALE ROTATING LL 32IN (MISCELLANEOUS) IMPLANT
SET IV EXT TUBING FEMALE 31 (MISCELLANEOUS) IMPLANT
SLEEVE SCD COMPRESS KNEE MED (MISCELLANEOUS) ×3 IMPLANT
SLING ARM MED ADULT FOAM STRAP (SOFTGOODS) ×4 IMPLANT
SPLINT PLASTER CAST XFAST 3X15 (CAST SUPPLIES) IMPLANT
SPLINT PLASTER XTRA FASTSET 3X (CAST SUPPLIES)
SPONGE GAUZE 4X4 12PLY (GAUZE/BANDAGES/DRESSINGS) ×3 IMPLANT
STOCKINETTE 4X48 STRL (DRAPES) ×3 IMPLANT
SUT ETHIBOND 3-0 V-5 (SUTURE) IMPLANT
SUT FIBERWIRE 2-0 18 17.9 3/8 (SUTURE)
SUT FIBERWIRE 3-0 18 TAPR NDL (SUTURE)
SUT MERSILENE 2.0 SH NDLE (SUTURE) IMPLANT
SUT MERSILENE 4 0 P 3 (SUTURE) ×2 IMPLANT
SUT MON AB 3-0 SH 27 (SUTURE)
SUT MON AB 3-0 SH27 (SUTURE) IMPLANT
SUT SILK 4 0 PS 2 (SUTURE) IMPLANT
SUT STEEL 3 0 (SUTURE) IMPLANT
SUT STEEL 4 0 (SUTURE) IMPLANT
SUT STEEL 4 0 V 26 (SUTURE) IMPLANT
SUT VIC AB 4-0 P2 18 (SUTURE) IMPLANT
SUT VICRYL 4-0 PS2 18IN ABS (SUTURE) IMPLANT
SUT VICRYL RAPIDE 4/0 PS 2 (SUTURE) ×3 IMPLANT
SUTURE FIBERWR 2-0 18 17.9 3/8 (SUTURE) IMPLANT
SUTURE FIBERWR 3-0 18 TAPR NDL (SUTURE) IMPLANT
SYR BULB 3OZ (MISCELLANEOUS) ×3 IMPLANT
SYR CONTROL 10ML LL (SYRINGE) IMPLANT
TOWEL OR 17X24 6PK STRL BLUE (TOWEL DISPOSABLE) ×6 IMPLANT
UNDERPAD 30X30 INCONTINENT (UNDERPADS AND DIAPERS) ×3 IMPLANT

## 2013-11-02 NOTE — H&P (Signed)
Whitney [name] is a 53 year old right hand dominant female who comes in complaining of pain in her right thumb. She suffered a zip line injury on 09-17-13. She did not seek medical attention. She complains of pain at the MCP joint with any gripping or pinching. She has no prior history of injury. She states that she caught her thumb at the brake and this hit her on the radial side of her thumb at the MCP joint. She has had pain in that area since that time. She obtained a splint on her own and wore it for approximately 4 days. She states that this caused some relief but increased pain on removal. No history of diabetes, thyroid problems, arthritis or gout. She complains of a constant, moderate, sharp and aching type pain with a feeling of swelling and weakness. Activity makes it worse especially gripping.   PAST MEDICAL HISTORY: She has no known drug allergies. She is on Prozac. She has had multiple breast surgeries, lumpectomy, mastectomy, and reconstruction between 2001 and 2002.   FAMILY H ISTORY: Negative.  SOCIAL HISTORY: She smokes half PPD is advised to quit and the reasons behind this. She drinks socially. 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   REVIEW OF SYSTEMS: Positive for breast cancer and glasses, otherwise negative for 14 points.  Whitney [name] is an 53 y.o. female.   Chief Complaint: radial collateral ligament avulsion MCP rt thumb HPI: see above  Past Medical History  Diagnosis Date  . Cancer     breast-rt  . Depression   . Wears glasses     Past Surgical History  Procedure Laterality Date  . Breast lumpectomy with axillary lymph node biopsy  2001    right  . Breast reconstruction  2001    right trans flap with mastectomy  . Mastectomy  2001    right-followed by reconstruction  . Reconstruction / correction of nipple / aerola  2003  . Breast reconstruction  2002    revision rt flap  . Cyst excision  2004    rt axilla  .  Laparoscopic vaginal hysterectomy  2010  . Bilateral salpingoophorectomy  2010    with hyst  . Hand exploration      age 40-injury-repair trama-rt  . Dilation and curettage of uterus      History reviewed. No pertinent family history. Social History:  reports that she has been smoking.  She does not have any smokeless tobacco history on file. She reports that she drinks alcohol. She reports that she does not use illicit drugs.  Allergies: No Known Allergies  No prescriptions prior to admission    No results found for this or any previous visit (from the past 48 hour(s)).  No results found.   Pertinent items are noted in HPI.  Height 5\' 1"  (1.549 m), weight 160 lb (72.576 kg).  General appearance: alert, cooperative and appears stated age Head: Normocephalic, without obvious abnormality Neck: no JVD Resp: clear to auscultation bilaterally Cardio: regular rate and rhythm, S1, S2 normal, no murmur, click, rub or gallop GI: soft, non-tender; bowel sounds normal; no masses,  no organomegaly Extremities: extremities normal, atraumatic, no cyanosis or edema Pulses: 2+ and symmetric Skin: Skin color, texture, turgor normal. No rashes or lesions Neurologic: Grossly normal Incision/Wound: na  Assessment/Plan X-rays reveal a fractured base. She is now 6 weeks following injury. We would recommend reconstruction of this with removal of the fragment and repair of the collateral ligament  radial side to the proximal phalanx. The pre, peri and post op course are discussed along with risks and complications.  She is aware there is no guarantee with surgery, possibility of infection, recurrence, injury to arteries, nerves and tendons, incomplete relief of symptoms and dystrophy.  She is advised of the splinting that is necessary. This will be scheduled as an outpatient for reconstruction radial collateral ligament MCP joint right thumb under regional anesthesia.  Derica Leiber R 11/02/2013, 10:07  AM

## 2013-11-02 NOTE — Brief Op Note (Signed)
11/02/2013  1:36 PM  PATIENT:  Whitney [name]  53 y.o. female  PRE-OPERATIVE DIAGNOSIS:  RUPTURED RADIAL COLLATERAL LIGAMENT METACARPAL PHALANGEAL RIGHT THUMB  POST-OPERATIVE DIAGNOSIS:  RUPTURED RADIAL COLLATERAL LIGAMENT METACARPAL PHALANGEAL RIGHT THUMB  PROCEDURE:  Procedure(s): REPAIR RADIAL COLLATERAL LIGAMENT METACARPAL PHALANGEAL RIGHT THUMB (Right)  SURGEON:  Surgeon(s) and Role:    * Wynonia Sours, MD - Primary  PHYSICIAN ASSISTANT:   ASSISTANTS: none   ANESTHESIA:   regional and general  EBL:  Total I/O In: 1200 [I.V.:1200] Out: -   BLOOD ADMINISTERED:none  DRAINS: none   LOCAL MEDICATIONS USED:  NONE  SPECIMEN:  No Specimen  DISPOSITION OF SPECIMEN:  N/A  COUNTS:  YES  TOURNIQUET:  * Missing tourniquet times found for documented tourniquets in log:  [id number] *  DICTATION: .Other Dictation: Dictation Number [id number]  PLAN OF CARE: Discharge to home after PACU  PATIENT DISPOSITION:  PACU - hemodynamically stable.

## 2013-11-02 NOTE — Op Note (Signed)
Dictation Number [id number]

## 2013-11-02 NOTE — Transfer of Care (Signed)
Immediate Anesthesia Transfer of Care Note  Patient: Whitney [name] Seen  Procedure(s) Performed: Procedure(s): REPAIR RADIAL COLLATERAL LIGAMENT METACARPAL PHALANGEAL RIGHT THUMB (Right)  Patient Location: PACU  Anesthesia Type:MAC combined with regional for post-op pain  Level of Consciousness: awake, alert , oriented and patient cooperative  Airway & Oxygen Therapy: Patient Spontanous Breathing and Patient connected to face mask oxygen  Post-op Assessment: Report given to PACU RN and Post -op Vital signs reviewed and stable  Post vital signs: Reviewed and stable  Complications: No apparent anesthesia complications

## 2013-11-02 NOTE — Progress Notes (Signed)
Assisted Dr. Crews with right, ultrasound guided, interscalene  block. Side rails up, monitors on throughout procedure. See vital signs in flow sheet. Tolerated Procedure well. 

## 2013-11-02 NOTE — Anesthesia Postprocedure Evaluation (Signed)
  Anesthesia Post-op Note  Patient: Whitney [name] Seen  Procedure(s) Performed: Procedure(s): REPAIR RADIAL COLLATERAL LIGAMENT METACARPAL PHALANGEAL RIGHT THUMB (Right)  Patient Location: PACU  Anesthesia Type:GA combined with regional for post-op pain  Level of Consciousness: awake, alert  and oriented  Airway and Oxygen Therapy: Patient Spontanous Breathing  Post-op Pain: none  Post-op Assessment: Post-op Vital signs reviewed  Post-op Vital Signs: Reviewed  Complications: No apparent anesthesia complications

## 2013-11-02 NOTE — Anesthesia Procedure Notes (Signed)
Anesthesia Regional Block:  Supraclavicular block  Pre-Anesthetic Checklist: ,, timeout performed, Correct Patient, Correct Site, Correct Laterality, Correct Procedure, Correct Position, site marked, Risks and benefits discussed,  Surgical consent,  Pre-op evaluation,  At surgeon's request and post-op pain management  Laterality: Right and Upper  Prep: chloraprep       Needles:  Injection technique: Single-shot  Needle Type: Echogenic Stimulator Needle     Needle Length: 5cm 5 cm Needle Gauge: 21 and 21 G    Additional Needles:  Procedures: ultrasound guided (picture in chart) Supraclavicular block Narrative:  Start time: 11/02/2013 11:43 AM End time: 11/02/2013 11:48 AM Injection made incrementally with aspirations every 5 mL.  Performed by: Personally  Anesthesiologist: Lorrene Reid

## 2013-11-02 NOTE — Anesthesia Preprocedure Evaluation (Signed)
Anesthesia Evaluation  Patient identified by MRN, date of birth, ID band Patient awake    Reviewed: Allergy & Precautions, H&P , NPO status , Patient's Chart, lab work & pertinent test results  Airway Mallampati: I TM Distance: >3 FB Neck ROM: Full    Dental  (+) Teeth Intact, Dental Advisory Given   Pulmonary Current Smoker,  breath sounds clear to auscultation        Cardiovascular Rhythm:Regular Rate:Normal     Neuro/Psych    GI/Hepatic   Endo/Other    Renal/GU      Musculoskeletal   Abdominal   Peds  Hematology   Anesthesia Other Findings   Reproductive/Obstetrics                           Anesthesia Physical Anesthesia Plan  ASA: I  Anesthesia Plan: MAC and General   Post-op Pain Management:    Induction: Intravenous  Airway Management Planned: Simple Face Mask and LMA  Additional Equipment:   Intra-op Plan:   Post-operative Plan: Extubation in OR  Informed Consent: I have reviewed the patients History and Physical, chart, labs and discussed the procedure including the risks, benefits and alternatives for the proposed anesthesia with the patient or authorized representative who has indicated his/her understanding and acceptance.   Dental advisory given  Plan Discussed with: CRNA, Anesthesiologist and Surgeon  Anesthesia Plan Comments: (Will try to do under MAC with SCB.)        Anesthesia Quick Evaluation

## 2013-11-02 NOTE — Discharge Instructions (Addendum)
°  Post Anesthesia Home Care Instructions ° °Activity: °Get plenty of rest for the remainder of the day. A responsible adult should stay with you for 24 hours following the procedure.  °For the next 24 hours, DO NOT: °-Drive a car °-Operate machinery °-Drink alcoholic beverages °-Take any medication unless instructed by your physician °-Make any legal decisions or sign important papers. ° °Meals: °Start with liquid foods such as gelatin or soup. Progress to regular foods as tolerated. Avoid greasy, spicy, heavy foods. If nausea and/or vomiting occur, drink only clear liquids until the nausea and/or vomiting subsides. Call your physician if vomiting continues. ° °Special Instructions/Symptoms: °Your throat may feel dry or sore from the anesthesia or the breathing tube placed in your throat during surgery. If this causes discomfort, gargle with warm salt water. The discomfort should disappear within 24 hours. ° ° ° ° °Regional Anesthesia Blocks ° °1. Numbness or the inability to move the "blocked" extremity may last from 3-48 hours after placement. The length of time depends on the medication injected and your individual response to the medication. If the numbness is not going away after 48 hours, call your surgeon. ° °2. The extremity that is blocked will need to be protected until the numbness is gone and the  Strength has returned. Because you cannot feel it, you will need to take extra care to avoid injury. Because it may be weak, you may have difficulty moving it or using it. You may not know what position it is in without looking at it while the block is in effect. ° °3. For blocks in the legs and feet, returning to weight bearing and walking needs to be done carefully. You will need to wait until the numbness is entirely gone and the strength has returned. You should be able to move your leg and foot normally before you try and bear weight or walk. You will need someone to be with you when you first try to  ensure you do not fall and possibly risk injury. ° °4. Bruising and tenderness at the needle site are common side effects and will resolve in a few days. ° °5. Persistent numbness or new problems with movement should be communicated to the surgeon or the  Surgery Center ([phone])/ [facility] ([phone]). ° ° ° ° °Hand Center Instructions °Hand Surgery ° °Wound Care: °Keep your hand elevated above the level of your heart.  Do not allow it to dangle by your side.  Keep the dressing dry and do not remove it unless your doctor advises you to do so.  He will usually change it at the time of your post-op visit.  Moving your fingers is advised to stimulate circulation but will depend on the site of your surgery.  If you have a splint applied, your doctor will advise you regarding movement. ° °Activity: °Do not drive or operate machinery today.  Rest today and then you may return to your normal activity and work as indicated by your physician. ° °Diet:  °Drink liquids today or eat a light diet.  You may resume a regular diet tomorrow.   ° °General expectations: °Pain for two to three days. °Fingers may become slightly swollen. ° °Call your doctor if any of the following occur: °Severe pain not relieved by pain medication. °Elevated temperature. °Dressing soaked with blood. °Inability to move fingers. °White or bluish color to fingers. °

## 2013-11-03 ENCOUNTER — Encounter (HOSPITAL_BASED_OUTPATIENT_CLINIC_OR_DEPARTMENT_OTHER): Payer: Self-pay | Admitting: Orthopedic Surgery

## 2013-11-03 NOTE — Op Note (Signed)
NAME:  Whitney [name], Whitney [name]               ACCOUNT NO.:  [id number]  MEDICAL RECORD NO.:  [id number]  LOCATION:                                 FACILITY:  PHYSICIAN:  Daryll Brod, M.D.            DATE OF BIRTH:  DATE OF PROCEDURE: DATE OF DISCHARGE:                              OPERATIVE REPORT   PREOPERATIVE DIAGNOSIS:  Avulsion radial collateral ligament, metacarpophalangeal joint, right thumb.  POSTOPERATIVE DIAGNOSIS:  Avulsion radial collateral ligament, metacarpophalangeal joint, right thumb.  OPERATION:  Reattachment radial collateral ligament, metacarpophalangeal joint, right thumb with a juggernaut.  SURGEON:  Daryll Brod, M.D.  ANESTHESIA:  Supraclavicular block with sedation.  ANESTHESIOLOGIST:  Lorrene Reid, M.D.  HISTORY:  The patient is a 53 year old female who was zip lining in [place] when she suffered an injury to her right thumb.  This occurred approximately 2 months ago.  She did not seek medical attention and has had continued pain at the metacarpophalangeal joint.  X-rays reveal a fracture avulsion of the metacarpophalangeal joint radial collateral ligament.  She is admitted for reattachment.  Pre, peri, and postoperative course have been discussed along with risks and complications.  She is aware that there is no guarantee with the surgery, possibility of infection; recurrence of injury to arteries, nerves, tendons, incomplete relief of symptoms, dystrophy.  In the preoperative area, the patient is seen, the extremity marked by both patient and surgeon.  Antibiotic given.  PROCEDURE IN DETAIL:  The patient was brought to the operating room, where a supraclavicular block anesthetic was carried out without difficulty.  She was prepped using ChloraPrep, supine position with the right arm free.  A 3-minute dry time was allowed, time-out taken, confirming the patient and procedure.  The limb was exsanguinated with an Esmarch bandage.  Tourniquet placed high on  the arm was inflated to 250 mmHg.  A curvilinear incision was made over the metacarpophalangeal joint apex radially, carried down through the subcutaneous tissue. Dorsal sensory radial nerve was identified and protected.  An incision made in the extensor hood on the radial side.  This allowed visualization of the collateral ligament.  This was traced distally to the base of the proximal phalanx where the avulsed fracture fragment was immediately encountered.  This showed no healing.  The area was curetted, the fracture fragment removed, the joint irrigated, and a 1-mm juggernaut was then placed after drilling this with the drill pin.  This was placed, firmly fixed in position, and used to repair the collateral ligament back down to the bony attachment.  X-rays confirmed positioning in both AP and lateral direction with good coaptation.  The extensor foot was then repaired with a running 4-0 Mersilene suture, and the skin with interrupted 4-0 Vicryl Rapide.  A sterile compressive dressing, dorsal palmar radial gutter splint applied, thumb spica in nature stabilizing the thumb to the level of the IP joint and distal phalanx.  On deflation of the tourniquet, all fingers immediately pink.  She was taken to the recovery room for observation in a satisfactory condition.  She will be discharged to home to return to the [facility] in 1  week on Vicodin.          ______________________________ Daryll Brod, M.D.     GK/MEDQ  D:  11/02/2013  T:  11/03/2013  Job:  [id number]

## 2014-01-05 ENCOUNTER — Other Ambulatory Visit: Payer: Self-pay

## 2014-01-05 DIAGNOSIS — Z1231 Encounter for screening mammogram for malignant neoplasm of breast: Secondary | ICD-10-CM

## 2014-02-14 ENCOUNTER — Ambulatory Visit
Admission: RE | Admit: 2014-02-14 | Discharge: 2014-02-14 | Disposition: A | Discharge status: Home / Self Care | Payer: Managed Care, Other (non HMO) | Source: Ambulatory Visit

## 2014-02-14 ENCOUNTER — Encounter (INDEPENDENT_AMBULATORY_CARE_PROVIDER_SITE_OTHER): Payer: Self-pay

## 2014-02-14 DIAGNOSIS — Z1231 Encounter for screening mammogram for malignant neoplasm of breast: Secondary | ICD-10-CM

## 2014-06-29 ENCOUNTER — Other Ambulatory Visit: Payer: Self-pay | Admitting: Obstetrics and Gynecology

## 2014-07-03 LAB — CYTOLOGY - PAP

## 2015-03-22 ENCOUNTER — Other Ambulatory Visit: Payer: Self-pay

## 2015-03-22 DIAGNOSIS — Z1231 Encounter for screening mammogram for malignant neoplasm of breast: Secondary | ICD-10-CM

## 2015-03-23 ENCOUNTER — Encounter: Payer: Self-pay | Admitting: Acute Care

## 2015-04-30 ENCOUNTER — Ambulatory Visit: Payer: Managed Care, Other (non HMO)

## 2015-05-01 ENCOUNTER — Ambulatory Visit
Admission: RE | Admit: 2015-05-01 | Discharge: 2015-05-01 | Disposition: A | Discharge status: Home / Self Care | Payer: BC Managed Care – PPO | Source: Ambulatory Visit

## 2015-05-01 DIAGNOSIS — Z1231 Encounter for screening mammogram for malignant neoplasm of breast: Secondary | ICD-10-CM

## 2015-05-23 ENCOUNTER — Other Ambulatory Visit: Payer: Self-pay | Admitting: Acute Care

## 2015-05-23 DIAGNOSIS — Z87891 Personal history of nicotine dependence: Secondary | ICD-10-CM

## 2015-05-24 ENCOUNTER — Telehealth: Payer: Self-pay | Admitting: Acute Care

## 2015-05-24 NOTE — Telephone Encounter (Signed)
I have scheduled Whitney [name] for her shared decision making visit and scan on 06/06/15. She verbalized understanding of time and location of both appointments.

## 2015-06-06 ENCOUNTER — Encounter: Payer: Self-pay | Admitting: Acute Care

## 2015-06-06 ENCOUNTER — Ambulatory Visit (INDEPENDENT_AMBULATORY_CARE_PROVIDER_SITE_OTHER)
Admission: RE | Admit: 2015-06-06 | Discharge: 2015-06-06 | Disposition: A | Discharge status: Home / Self Care | Payer: BC Managed Care – PPO | Source: Ambulatory Visit | Attending: Acute Care | Admitting: Acute Care

## 2015-06-06 ENCOUNTER — Ambulatory Visit (INDEPENDENT_AMBULATORY_CARE_PROVIDER_SITE_OTHER): Payer: BC Managed Care – PPO | Admitting: Acute Care

## 2015-06-06 DIAGNOSIS — Z87891 Personal history of nicotine dependence: Secondary | ICD-10-CM | POA: Diagnosis not present

## 2015-06-06 NOTE — Progress Notes (Signed)
Shared Decision Making Visit Lung Cancer Screening Program [phone])   Eligibility:  Age 54 y.o.  Pack Years Smoking History Calculation 40 pack year (# packs/per year x # years smoked)  Recent History of coughing up blood  no  Unexplained weight loss? no ( >Than 15 pounds within the last 6 months )  Prior History Lung / other cancer no (Diagnosis within the last 5 years already requiring surveillance chest CT Scans).  Smoking Status Former Smoker  Former Smokers: Years since quit: < 1 year  Quit Date: 07/20/2014  Visit Components:  Discussion included one or more decision making aids. yes  Discussion included risk/benefits of screening. yes  Discussion included potential follow up diagnostic testing for abnormal scans. yes  Discussion included meaning and risk of over diagnosis. yes  Discussion included meaning and risk of False Positives. yes  Discussion included meaning of total radiation exposure. yes  Counseling Included:  Importance of adherence to annual lung cancer LDCT screening. yes  Impact of comorbidities on ability to participate in the program. yes  Ability and willingness to under diagnostic treatment. yes  Smoking Cessation Counseling:  Current Smokers:   Discussed importance of smoking cessation.NA: Former smoker  Information about tobacco cessation classes and interventions provided to patient. NA  Patient provided with "ticket" for LDCT Scan. NA  Symptomatic Patient. No  Counseling  Diagnosis Code: Tobacco Use Z72.0  Asymptomatic Patient yes  Counseling   Former Smokers:   Discussed the importance of maintaining cigarette abstinence. yes  Diagnosis Code: Personal History of Nicotine Dependence. T66.060  Information about tobacco cessation classes and interventions provided to patient. Yes  Patient provided with "ticket" for LDCT Scan. yes  Written Order for Lung Cancer Screening with LDCT placed in Epic. Yes (CT Chest Lung  Cancer Screening Low Dose W/O CM) [id number] Z12.2-Screening of respiratory organs Z87.891-Personal history of nicotine dependence  I spent 15 minutes of face to face time with Whitney [name] discussing the risks and benefits of lung cancer screening. We viewed a power point together that addressed the above noted topics, stopping at intervals to allow for questions to be asked and answered and to allow for discussion as needed. Whitney [name] quit smoking 07/20/14 with the help of the smoking cessation program at [facility]. Both she and her husband are transferring their screening location to the [facility] program here in [place]. We discussed triggers that could lead to smoking again, and I told her if she ever has the desire to smoke again to call me so that we can provide support and access to smoking cessation medications and support groups here . She verbalized understanding. We discussed the time and location of her scan, and I gave her a copy of the power point we viewed together to refer to in the future as needed. I also gave her my card and contact information in the event she has any further questions. I explained that I will call with the scan results within 24-48 hours She verbalized understanding and had no further questions upon leaving the office.   Whitney Spatz, NP

## 2015-06-07 ENCOUNTER — Telehealth: Payer: Self-pay | Admitting: Acute Care

## 2015-06-07 NOTE — Telephone Encounter (Signed)
I called to give Ms. Stelly the results of her LDCT. I explained that the scan was read as a Lung RADS 1, no pulmonary nodules, negative scan. Recommendation is for a repeat scan in 12 months. She verbalized understanding of the results and had no further questions for me. I told her we would call in 1 year and schedule her scan. She verbalized understanding.She has my contact information in the event she has any further questions. I will message Carolann Littler, MD, her PCP and let him know the results.

## 2015-12-12 ENCOUNTER — Other Ambulatory Visit: Payer: Self-pay | Admitting: Acute Care

## 2015-12-12 DIAGNOSIS — Z87891 Personal history of nicotine dependence: Secondary | ICD-10-CM

## 2016-04-01 ENCOUNTER — Other Ambulatory Visit: Payer: Self-pay | Admitting: Family Medicine

## 2016-04-01 DIAGNOSIS — Z1231 Encounter for screening mammogram for malignant neoplasm of breast: Secondary | ICD-10-CM

## 2016-06-03 ENCOUNTER — Other Ambulatory Visit: Payer: Self-pay | Admitting: Family Medicine

## 2016-06-03 ENCOUNTER — Ambulatory Visit
Admission: RE | Admit: 2016-06-03 | Discharge: 2016-06-03 | Disposition: A | Discharge status: Home / Self Care | Payer: BC Managed Care – PPO | Source: Ambulatory Visit | Attending: Family Medicine | Admitting: Family Medicine

## 2016-06-03 DIAGNOSIS — Z1231 Encounter for screening mammogram for malignant neoplasm of breast: Secondary | ICD-10-CM

## 2016-06-09 ENCOUNTER — Ambulatory Visit (INDEPENDENT_AMBULATORY_CARE_PROVIDER_SITE_OTHER)
Admission: RE | Admit: 2016-06-09 | Discharge: 2016-06-09 | Disposition: A | Discharge status: Home / Self Care | Payer: BC Managed Care – PPO | Source: Ambulatory Visit | Attending: Acute Care | Admitting: Acute Care

## 2016-06-09 ENCOUNTER — Telehealth: Payer: Self-pay | Admitting: Acute Care

## 2016-06-09 DIAGNOSIS — Z87891 Personal history of nicotine dependence: Secondary | ICD-10-CM | POA: Diagnosis not present

## 2016-06-09 NOTE — Telephone Encounter (Signed)
I called the results of Whitney [name]'s low-dose CT screening to her. I explained to her that her scan was read as a Lung RADS 2: nodules that are benign in appearance and behavior with a very low likelihood of becoming a clinically active cancer due to size or lack of growth. Recommendation per radiology is for a repeat LDCT in 12 months. I explained that we will call her and schedule her follow-up scan for September 2018. She verbalized understanding of the above, and had no further questions. I will fax a copy of the results to her primary care physician Dr. Harlan Stains at the patient's request.

## 2017-06-22 ENCOUNTER — Ambulatory Visit (INDEPENDENT_AMBULATORY_CARE_PROVIDER_SITE_OTHER)
Admission: RE | Admit: 2017-06-22 | Discharge: 2017-06-22 | Disposition: A | Discharge status: Home / Self Care | Payer: BC Managed Care – PPO | Source: Ambulatory Visit | Attending: Acute Care | Admitting: Acute Care

## 2017-06-22 DIAGNOSIS — Z87891 Personal history of nicotine dependence: Secondary | ICD-10-CM | POA: Diagnosis not present

## 2017-06-23 ENCOUNTER — Other Ambulatory Visit: Payer: Self-pay | Admitting: Acute Care

## 2017-06-23 DIAGNOSIS — Z87891 Personal history of nicotine dependence: Secondary | ICD-10-CM

## 2017-06-23 DIAGNOSIS — Z122 Encounter for screening for malignant neoplasm of respiratory organs: Secondary | ICD-10-CM

## 2017-07-14 ENCOUNTER — Other Ambulatory Visit: Payer: Self-pay | Admitting: Family Medicine

## 2017-07-14 DIAGNOSIS — Z1231 Encounter for screening mammogram for malignant neoplasm of breast: Secondary | ICD-10-CM

## 2017-07-17 ENCOUNTER — Ambulatory Visit
Admission: RE | Admit: 2017-07-17 | Discharge: 2017-07-17 | Disposition: A | Discharge status: Home / Self Care | Payer: BC Managed Care – PPO | Source: Ambulatory Visit | Attending: Family Medicine | Admitting: Family Medicine

## 2017-07-17 DIAGNOSIS — Z1231 Encounter for screening mammogram for malignant neoplasm of breast: Secondary | ICD-10-CM

## 2017-07-27 ENCOUNTER — Telehealth: Payer: Self-pay | Admitting: Acute Care

## 2017-07-27 NOTE — Telephone Encounter (Signed)
This patient's husband had already emailed me about this about 2 weeks ago. I passed the info on to Kathlee Nations to see if there was anything we can do to assist. Unfortunately his insurance carrier has changed their policy regarding screening. I am aware of the issue however, based on his previous e mail. Thanks so much.

## 2017-07-27 NOTE — Telephone Encounter (Signed)
Will close encounter, as nothing further is needed.  

## 2017-07-27 NOTE — Telephone Encounter (Signed)
Spoke with pt spouse, [name] who states there are issues with billing for CT low dose. Whitney [name] is not paying as much as they typically do. Whitney states they are appealing to insurance and wanted to make SG aware.  Will route to SG to make her aware.

## 2018-02-05 ENCOUNTER — Other Ambulatory Visit: Payer: Self-pay | Admitting: Family Medicine

## 2018-02-05 ENCOUNTER — Ambulatory Visit
Admission: RE | Admit: 2018-02-05 | Discharge: 2018-02-05 | Disposition: A | Discharge status: Home / Self Care | Payer: BC Managed Care – PPO | Source: Ambulatory Visit | Attending: Family Medicine | Admitting: Family Medicine

## 2018-02-05 DIAGNOSIS — S93402D Sprain of unspecified ligament of left ankle, subsequent encounter: Secondary | ICD-10-CM

## 2018-06-16 ENCOUNTER — Telehealth: Payer: Self-pay | Admitting: Acute Care

## 2018-06-17 NOTE — Telephone Encounter (Signed)
Whitney [name] will be out of town for most of October and she wanted to schedule the CT for 07/23/2018 @ 10:30am. It has been done

## 2018-06-25 ENCOUNTER — Other Ambulatory Visit: Payer: Self-pay | Admitting: Family Medicine

## 2018-06-25 DIAGNOSIS — Z1231 Encounter for screening mammogram for malignant neoplasm of breast: Secondary | ICD-10-CM

## 2018-07-23 ENCOUNTER — Inpatient Hospital Stay: Admission: RE | Admit: 2018-07-23 | Payer: BC Managed Care – PPO | Source: Ambulatory Visit

## 2018-07-30 ENCOUNTER — Ambulatory Visit
Admission: RE | Admit: 2018-07-30 | Discharge: 2018-07-30 | Disposition: A | Discharge status: Home / Self Care | Payer: BC Managed Care – PPO | Source: Ambulatory Visit | Attending: Family Medicine | Admitting: Family Medicine

## 2018-07-30 DIAGNOSIS — Z1231 Encounter for screening mammogram for malignant neoplasm of breast: Secondary | ICD-10-CM

## 2018-08-04 ENCOUNTER — Ambulatory Visit (INDEPENDENT_AMBULATORY_CARE_PROVIDER_SITE_OTHER)
Admission: RE | Admit: 2018-08-04 | Discharge: 2018-08-04 | Disposition: A | Discharge status: Home / Self Care | Payer: BC Managed Care – PPO | Source: Ambulatory Visit | Attending: Acute Care | Admitting: Acute Care

## 2018-08-04 DIAGNOSIS — Z87891 Personal history of nicotine dependence: Secondary | ICD-10-CM

## 2018-08-04 DIAGNOSIS — Z122 Encounter for screening for malignant neoplasm of respiratory organs: Secondary | ICD-10-CM

## 2018-08-11 ENCOUNTER — Telehealth: Payer: Self-pay | Admitting: Acute Care

## 2018-08-11 DIAGNOSIS — Z122 Encounter for screening for malignant neoplasm of respiratory organs: Secondary | ICD-10-CM

## 2018-08-11 DIAGNOSIS — Z87891 Personal history of nicotine dependence: Secondary | ICD-10-CM

## 2018-08-11 NOTE — Telephone Encounter (Signed)
Pt informed of CT results per Sarah Groce, NP.  PT verbalized understanding.  Copy sent to PCP.  Order placed for 1 yr f/u CT.  

## 2018-09-24 ENCOUNTER — Telehealth: Payer: Self-pay | Admitting: Acute Care

## 2018-09-24 NOTE — Telephone Encounter (Signed)
Spoke with pt and advised pt to call [employer] to appeal the charge for Low dose Chest screening CT and also call out billing department to let them know you are appealing this so that can note that on her record.  Pt verbalized understanding.  Nothing further needed at this time.

## 2018-09-24 NOTE — Telephone Encounter (Signed)
Spoke with pt regarding bill she received for lung screening CT.  Pt states that this is the first year that [employer] has denied coverage and wanted to know what she can do to appeal this.I advised pt I would talk with Mercer Pod in our coding department and see what the next step should be.  PT aware that I will call her back .

## 2019-06-15 ENCOUNTER — Other Ambulatory Visit: Payer: Self-pay | Admitting: Family Medicine

## 2019-06-15 DIAGNOSIS — Z1231 Encounter for screening mammogram for malignant neoplasm of breast: Secondary | ICD-10-CM

## 2019-08-08 ENCOUNTER — Other Ambulatory Visit: Payer: Self-pay

## 2019-08-08 ENCOUNTER — Ambulatory Visit
Admission: RE | Admit: 2019-08-08 | Discharge: 2019-08-08 | Disposition: A | Discharge status: Home / Self Care | Payer: Self-pay | Source: Ambulatory Visit | Attending: Acute Care | Admitting: Acute Care

## 2019-08-08 ENCOUNTER — Ambulatory Visit
Admission: RE | Admit: 2019-08-08 | Discharge: 2019-08-08 | Disposition: A | Discharge status: Home / Self Care | Payer: BC Managed Care – PPO | Source: Ambulatory Visit | Attending: Family Medicine | Admitting: Family Medicine

## 2019-08-08 DIAGNOSIS — Z122 Encounter for screening for malignant neoplasm of respiratory organs: Secondary | ICD-10-CM

## 2019-08-08 DIAGNOSIS — Z87891 Personal history of nicotine dependence: Secondary | ICD-10-CM

## 2019-08-08 DIAGNOSIS — Z1231 Encounter for screening mammogram for malignant neoplasm of breast: Secondary | ICD-10-CM

## 2019-08-09 ENCOUNTER — Other Ambulatory Visit: Payer: Self-pay | Admitting: Family Medicine

## 2019-08-09 DIAGNOSIS — R928 Other abnormal and inconclusive findings on diagnostic imaging of breast: Secondary | ICD-10-CM

## 2019-08-15 ENCOUNTER — Other Ambulatory Visit: Payer: Self-pay

## 2019-08-15 ENCOUNTER — Ambulatory Visit
Admission: RE | Admit: 2019-08-15 | Discharge: 2019-08-15 | Disposition: A | Discharge status: Home / Self Care | Payer: BC Managed Care – PPO | Source: Ambulatory Visit | Attending: Family Medicine | Admitting: Family Medicine

## 2019-08-15 DIAGNOSIS — R928 Other abnormal and inconclusive findings on diagnostic imaging of breast: Secondary | ICD-10-CM

## 2019-08-22 ENCOUNTER — Other Ambulatory Visit: Payer: Self-pay | Admitting: *Deleted

## 2019-08-22 DIAGNOSIS — Z122 Encounter for screening for malignant neoplasm of respiratory organs: Secondary | ICD-10-CM

## 2019-08-22 DIAGNOSIS — Z87891 Personal history of nicotine dependence: Secondary | ICD-10-CM

## 2019-08-27 ENCOUNTER — Other Ambulatory Visit: Payer: Self-pay

## 2019-08-27 ENCOUNTER — Ambulatory Visit (INDEPENDENT_AMBULATORY_CARE_PROVIDER_SITE_OTHER): Payer: BC Managed Care – PPO

## 2019-08-27 ENCOUNTER — Ambulatory Visit (HOSPITAL_COMMUNITY)
Admission: EM | Admit: 2019-08-27 | Discharge: 2019-08-27 | Disposition: A | Discharge status: Home / Self Care | Payer: BC Managed Care – PPO | Attending: Family Medicine | Admitting: Family Medicine

## 2019-08-27 ENCOUNTER — Encounter (HOSPITAL_COMMUNITY): Payer: Self-pay

## 2019-08-27 DIAGNOSIS — S8265XA Nondisplaced fracture of lateral malleolus of left fibula, initial encounter for closed fracture: Secondary | ICD-10-CM

## 2019-08-27 NOTE — ED Provider Notes (Signed)
[facility]    CSN: [id number] Arrival date & time: 08/27/19  1458      History   Chief Complaint Chief Complaint  Patient presents with  . Foot Injury    Left ankle     HPI Whitney [name] is a 58 y.o. female.   She is presenting with left ankle pain.  She sustained a fall earlier today.  She is having swelling over the distal fibula.  She is unable to walk without pain.  She has been putting ice on it.  The pain is worse with ambulation.  The pain is constant.  Pain is moderate to severe.  Denies any ecchymosis.  No history of similar symptoms.  HPI  Past Medical History:  Diagnosis Date  . Cancer ([place])    breast-rt  . Depression   . Wears glasses     There are no active problems to display for this patient.   Past Surgical History:  Procedure Laterality Date  . BILATERAL SALPINGOOPHORECTOMY  2010   with hyst  . BREAST LUMPECTOMY WITH AXILLARY LYMPH NODE BIOPSY  2001   right  . BREAST RECONSTRUCTION  2001   right trans flap with mastectomy  . BREAST RECONSTRUCTION  2002   revision rt flap  . CYST EXCISION  2004   rt axilla  . DILATION AND CURETTAGE OF UTERUS    . HAND EXPLORATION     age 56-injury-repair trama-rt  . LAPAROSCOPIC VAGINAL HYSTERECTOMY  2010  . LIGAMENT REPAIR Right 11/02/2013   Procedure: REPAIR RADIAL COLLATERAL LIGAMENT METACARPAL PHALANGEAL RIGHT THUMB;  Surgeon: Wynonia Sours, MD;  Location: [facility];  Service: Orthopedics;  Laterality: Right;  . MASTECTOMY  2001   right-followed by reconstruction  . RECONSTRUCTION / CORRECTION OF NIPPLE / AEROLA  2003    OB History   No obstetric history on file.      Home Medications    Prior to Admission medications   Medication Sig Start Date End Date Taking? Authorizing Provider  FLUoxetine (PROZAC) 40 MG capsule Take 40 mg by mouth daily.    [provider]  HYDROcodone-acetaminophen (NORCO) 5-325 MG per tablet Take 1 tablet by mouth every 6 (six) hours as  needed for moderate pain. 11/02/13   Daryll Brod, MD  Multiple Vitamins-Minerals (MULTIVITAMIN WITH MINERALS) tablet Take 1 tablet by mouth daily.    [provider]    Family History History reviewed. No pertinent family history.  Social History Social History   Tobacco Use  . Smoking status: Former Smoker    Packs/day: 0.50  . Smokeless tobacco: Former Systems developer    Quit date: 07/20/2014  . Tobacco comment: Encouraged to remain smoke free  Substance Use Topics  . Alcohol use: Yes    Alcohol/week: 0.0 standard drinks  . Drug use: No     Allergies   Patient has no known allergies.   Review of Systems Review of Systems  Constitutional: Negative for fever.  HENT: Negative for congestion.   Respiratory: Negative for cough.   Cardiovascular: Negative for chest pain.  Gastrointestinal: Negative for abdominal pain.  Musculoskeletal: Positive for gait problem.  Skin: Negative for color change.  Neurological: Negative for weakness.  Hematological: Negative for adenopathy.     Physical Exam Triage Vital Signs ED Triage Vitals  Enc Vitals Group     BP 08/27/19 1601 (!) 178/76     Pulse Rate 08/27/19 1601 71     Resp 08/27/19 1601 16  Temp 08/27/19 1601 98.6 F (37 C)     Temp Source 08/27/19 1601 Oral     SpO2 08/27/19 1601 99 %     Weight --      Height --      Head Circumference --      Peak Flow --      Pain Score 08/27/19 1559 10     Pain Loc --      Pain Edu? --      Excl. in [place]? --    No data found.  Updated Vital Signs BP (!) 178/76 (BP Location: Right Arm)   Pulse 71   Temp 98.6 F (37 C) (Oral)   Resp 16   SpO2 99%   Visual Acuity Right Eye Distance:   Left Eye Distance:   Bilateral Distance:    Right Eye Near:   Left Eye Near:    Bilateral Near:     Physical Exam Gen: NAD, alert, cooperative with exam, well-appearing ENT: normal lips, normal nasal mucosa,  Eye: normal EOM, normal conjunctiva and lids CV:  no edema, +2 pedal  pulses   Resp: no accessory muscle use, non-labored,   Skin: no rashes, no areas of induration  Neuro: normal tone, normal sensation to touch Psych:  normal insight, alert and oriented MSK:  Left ankle: Tenderness palpation of the lateral malleolus. Swelling over the lateral malleolus. Limited range of motion. No tenderness to palpation at the base of the fifth. Neurovascularly intact   UC Treatments / Results  Labs (all labs ordered are listed, but only abnormal results are displayed) Labs Reviewed - No data to display  EKG   Radiology Dg Ankle Complete Left  Result Date: 08/27/2019 CLINICAL DATA:  58 year old female with left ankle pain. EXAM: LEFT ANKLE COMPLETE - 3+ VIEW COMPARISON:  Left ankle radiograph dated 02/05/2018. FINDINGS: Nondisplaced transverse fracture of the lateral malleolus. No other acute fracture. The ankle mortise is intact. No degenerative changes. There is mild soft tissue swelling over the lateral malleolus. IMPRESSION: Nondisplaced transverse fracture of the lateral malleolus. Electronically Signed   By: Anner Crete M.D.   On: 08/27/2019 17:27    Procedures Procedures (including critical care time)  Medications Ordered in UC Medications - No data to display  Initial Impression / Assessment and Plan / UC Course  I have reviewed the triage vital signs and the nursing notes.  Pertinent labs & imaging results that were available during my care of the patient were reviewed by me and considered in my medical decision making (see chart for details).     Whitney [name] is a 58 year old female is presenting with a fracture of the lateral malleolus.  She was placed in a cam walker.  Counseled on pain management and supportive care.  Given indications to follow-up.  Final Clinical Impressions(s) / UC Diagnoses   Final diagnoses:  Closed nondisplaced fracture of lateral malleolus of left fibula, initial encounter     Discharge Instructions      Please continue ice  Please try tylenol  Please follow up in one week.     ED Prescriptions    None     PDMP not reviewed this encounter.   Rosemarie Ax, MD 08/27/19 [phone]

## 2019-08-27 NOTE — ED Triage Notes (Signed)
Pt present left foot/ ankle injury. Pt states that she slipped off the side walk while she was working.

## 2019-08-27 NOTE — Discharge Instructions (Signed)
Please continue ice  Please try tylenol  Please follow up in one week.

## 2019-08-27 NOTE — ED Notes (Signed)
Cam walker applied by dr Kathreen Cosier

## 2019-09-05 ENCOUNTER — Encounter: Payer: Self-pay | Admitting: Family Medicine

## 2019-09-05 ENCOUNTER — Other Ambulatory Visit: Payer: Self-pay

## 2019-09-05 ENCOUNTER — Ambulatory Visit (HOSPITAL_BASED_OUTPATIENT_CLINIC_OR_DEPARTMENT_OTHER)
Admission: RE | Admit: 2019-09-05 | Discharge: 2019-09-05 | Disposition: A | Discharge status: Home / Self Care | Payer: BC Managed Care – PPO | Source: Ambulatory Visit | Attending: Family Medicine | Admitting: Family Medicine

## 2019-09-05 ENCOUNTER — Ambulatory Visit: Payer: BC Managed Care – PPO | Admitting: Family Medicine

## 2019-09-05 VITALS — BP 164/93 | HR 74 | Ht 62.0 in | Wt 170.0 lb

## 2019-09-05 DIAGNOSIS — S8262XA Displaced fracture of lateral malleolus of left fibula, initial encounter for closed fracture: Secondary | ICD-10-CM | POA: Diagnosis present

## 2019-09-05 NOTE — Patient Instructions (Signed)
Good to see you Please continue ice  Pleas try the range of motion exercises  I will call you with the results   Please send me a message in MyChart with any questions or updates.  Please see me back in 4 weeks.   --Dr. Raeford Razor

## 2019-09-05 NOTE — Assessment & Plan Note (Signed)
Injury occurred on 12/5.  Doing well with the cam walker. -X-ray. -Counseled on home exercise therapy and supportive care. -Follow-up in 4 weeks.

## 2019-09-05 NOTE — Progress Notes (Signed)
Whitney [name] - 58 y.o. female MRN [id number]  Date of birth: 08/18/1961  SUBJECTIVE:  Including CC & ROS.  Chief Complaint  Patient presents with  . Ankle Injury    left ankle x 08/27/2019    Whitney [name] is a 58 y.o. female that is presenting with a left ankle fracture.  The injury occurred on 12/5.  She was placed in a cam walker at that time.  She has had improvement of the swelling and pain since that time.  She has not had to take any Advil.  The pain is localized to the lateral malleolus.  She denies any range of motion issues.  No history of stress fracture.  Pain is intermittent in nature.  No numbness or tingling.  Independent review of the left ankle x-ray from 12/5 shows a nondisplaced distal fibula fracture.   Review of Systems  Constitutional: Negative for fever.  HENT: Negative for congestion.   Respiratory: Negative for cough.   Cardiovascular: Negative for chest pain.  Gastrointestinal: Negative for abdominal pain.  Musculoskeletal: Positive for gait problem.  Skin: Negative for color change.  Neurological: Negative for weakness.  Hematological: Negative for adenopathy.    HISTORY: Past Medical, Surgical, Social, and Family History Reviewed & Updated per EMR.   Pertinent Historical Findings include:  Past Medical History:  Diagnosis Date  . Cancer ([place])    breast-rt  . Depression   . Wears glasses     Past Surgical History:  Procedure Laterality Date  . BILATERAL SALPINGOOPHORECTOMY  2010   with hyst  . BREAST LUMPECTOMY WITH AXILLARY LYMPH NODE BIOPSY  2001   right  . BREAST RECONSTRUCTION  2001   right trans flap with mastectomy  . BREAST RECONSTRUCTION  2002   revision rt flap  . CYST EXCISION  2004   rt axilla  . DILATION AND CURETTAGE OF UTERUS    . HAND EXPLORATION     age 84-injury-repair trama-rt  . LAPAROSCOPIC VAGINAL HYSTERECTOMY  2010  . LIGAMENT REPAIR Right 11/02/2013   Procedure: REPAIR RADIAL COLLATERAL LIGAMENT METACARPAL  PHALANGEAL RIGHT THUMB;  Surgeon: Wynonia Sours, MD;  Location: [facility];  Service: Orthopedics;  Laterality: Right;  . MASTECTOMY  2001   right-followed by reconstruction  . RECONSTRUCTION / CORRECTION OF NIPPLE / AEROLA  2003    No Known Allergies  No family history on file.   Social History   Socioeconomic History  . Marital status: Married    Spouse name: Not on file  . Number of children: Not on file  . Years of education: Not on file  . Highest education level: Not on file  Occupational History  . Not on file  Tobacco Use  . Smoking status: Former Smoker    Packs/day: 0.50  . Smokeless tobacco: Former Systems developer    Quit date: 07/20/2014  . Tobacco comment: Encouraged to remain smoke free  Substance and Sexual Activity  . Alcohol use: Yes    Alcohol/week: 0.0 standard drinks  . Drug use: No  . Sexual activity: Not on file  Other Topics Concern  . Not on file  Social History Narrative  . Not on file   Social Determinants of Health   Financial Resource Strain:   . Difficulty of Paying Living Expenses: Not on file  Food Insecurity:   . Worried About Charity fundraiser in the Last Year: Not on file  . Ran Out of Food in the Last Year: Not  on file  Transportation Needs:   . Lack of Transportation (Medical): Not on file  . Lack of Transportation (Non-Medical): Not on file  Physical Activity:   . Days of Exercise per Week: Not on file  . Minutes of Exercise per Session: Not on file  Stress:   . Feeling of Stress : Not on file  Social Connections:   . Frequency of Communication with Friends and Family: Not on file  . Frequency of Social Gatherings with Friends and Family: Not on file  . Attends Religious Services: Not on file  . Active Member of Clubs or Organizations: Not on file  . Attends Archivist Meetings: Not on file  . Marital Status: Not on file  Intimate Partner Violence:   . Fear of Current or Ex-Partner: Not on file  .  Emotionally Abused: Not on file  . Physically Abused: Not on file  . Sexually Abused: Not on file     PHYSICAL EXAM:  VS: BP (!) 164/93   Pulse 74   Ht 5\' 2"  (1.575 m)   Wt 170 lb (77.1 kg)   BMI 31.09 kg/m  Physical Exam Gen: NAD, alert, cooperative with exam, well-appearing ENT: normal lips, normal nasal mucosa,  Eye: normal EOM, normal conjunctiva and lids CV:  no edema, +2 pedal pulses   Resp: no accessory muscle use, non-labored,  Skin: no rashes, no areas of induration  Neuro: normal tone, normal sensation to touch Psych:  normal insight, alert and oriented MSK:  Left ankle: Obvious swelling of the lateral malleolus. Ecchymosis occurring over the lateral malleolus and lower leg. Normal range of motion. Tenderness to palpation over the lateral malleolus. Normal strength resistance. Neurovascular intact     ASSESSMENT & PLAN:   Closed fracture of distal lateral malleolus of left ankle Injury occurred on 12/5.  Doing well with the cam walker. -X-ray. -Counseled on home exercise therapy and supportive care. -Follow-up in 4 weeks.

## 2019-09-06 ENCOUNTER — Telehealth: Payer: Self-pay | Admitting: Family Medicine

## 2019-09-06 NOTE — Telephone Encounter (Signed)
Patient returning call for xray results  

## 2019-09-06 NOTE — Telephone Encounter (Signed)
Spoke to patient and gave information about x-ray results as provided by the physician.

## 2019-09-06 NOTE — Telephone Encounter (Signed)
Left VM for patient. If she calls back please have her speak with a nurse/CMA and inform that her xray shows the fracture and it is stable. Has expected healing taking place. Continue boot and follow up in one month.  If any questions then please take the best time and phone number to call and I will try to call her back.   Rosemarie Ax, MD [facility] 09/06/2019, 9:16 AM

## 2019-09-14 ENCOUNTER — Other Ambulatory Visit: Payer: Self-pay

## 2019-09-14 ENCOUNTER — Ambulatory Visit: Payer: BC Managed Care – PPO | Attending: Internal Medicine

## 2019-09-14 DIAGNOSIS — Z20822 Contact with and (suspected) exposure to covid-19: Secondary | ICD-10-CM

## 2019-09-16 LAB — NOVEL CORONAVIRUS, NAA: SARS-CoV-2, NAA: NOT DETECTED

## 2019-10-05 ENCOUNTER — Encounter: Payer: Self-pay | Admitting: Family Medicine

## 2019-10-05 ENCOUNTER — Ambulatory Visit: Payer: BC Managed Care – PPO | Admitting: Family Medicine

## 2019-10-05 ENCOUNTER — Ambulatory Visit (HOSPITAL_BASED_OUTPATIENT_CLINIC_OR_DEPARTMENT_OTHER)
Admission: RE | Admit: 2019-10-05 | Discharge: 2019-10-05 | Disposition: A | Discharge status: Home / Self Care | Payer: BC Managed Care – PPO | Source: Ambulatory Visit | Attending: Family Medicine | Admitting: Family Medicine

## 2019-10-05 ENCOUNTER — Other Ambulatory Visit: Payer: Self-pay

## 2019-10-05 VITALS — BP 143/85 | HR 76 | Ht 61.0 in | Wt 170.0 lb

## 2019-10-05 DIAGNOSIS — S8262XD Displaced fracture of lateral malleolus of left fibula, subsequent encounter for closed fracture with routine healing: Secondary | ICD-10-CM | POA: Diagnosis not present

## 2019-10-05 NOTE — Progress Notes (Signed)
Whitney [name] - 59 y.o. female MRN [id number]  Date of birth: 07-13-1961  SUBJECTIVE:  Including CC & ROS.  Chief Complaint  Patient presents with  . Follow-up    follow up for left ankle    Whitney [name] is a 59 y.o. female that is following up for her lateral malleolus fracture.  She had an injury on 12/5.  She feels improved over the past months.  She has had some soreness when she has been more active.  She has been icing and seems to improve her symptoms.  Has been compliant with the cam walker.  No numbness or tingling.   Review of Systems See HPI   HISTORY: Past Medical, Surgical, Social, and Family History Reviewed & Updated per EMR.   Pertinent Historical Findings include:  Past Medical History:  Diagnosis Date  . 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 Depression   . Wears glasses     Past Surgical History:  Procedure Laterality Date  . BILATERAL SALPINGOOPHORECTOMY  2010   with hyst  . BREAST LUMPECTOMY WITH AXILLARY LYMPH NODE BIOPSY  2001   right  . BREAST RECONSTRUCTION  2001   right trans flap with mastectomy  . BREAST RECONSTRUCTION  2002   revision rt flap  . CYST EXCISION  2004   rt axilla  . DILATION AND CURETTAGE OF UTERUS    . HAND EXPLORATION     age 49-injury-repair trama-rt  . LAPAROSCOPIC VAGINAL HYSTERECTOMY  2010  . LIGAMENT REPAIR Right 11/02/2013   Procedure: REPAIR RADIAL COLLATERAL LIGAMENT METACARPAL PHALANGEAL RIGHT THUMB;  Surgeon: Wynonia Sours, MD;  Location: [facility];  Service: Orthopedics;  Laterality: Right;  . MASTECTOMY  2001   right-followed by reconstruction  . RECONSTRUCTION / CORRECTION OF NIPPLE / AEROLA  2003    No Known Allergies  No family history on file.   Social History   Socioeconomic History  . Marital status: Married    Spouse name: Not on file  . Number of children: Not on file  . Years of education: Not on file  . Highest education level: Not on file  Occupational History  . Not on file    Tobacco Use  . Smoking status: Former Smoker    Packs/day: 0.50  . Smokeless tobacco: Former Systems developer    Quit date: 07/20/2014  . Tobacco comment: Encouraged to remain smoke free  Substance and Sexual Activity  . Alcohol use: Yes    Alcohol/week: 0.0 standard drinks  . Drug use: No  . Sexual activity: Not on file  Other Topics Concern  . Not on file  Social History Narrative  . Not on file   Social Determinants of Health   Financial Resource Strain:   . Difficulty of Paying Living Expenses: Not on file  Food Insecurity:   . Worried About Charity fundraiser in the Last Year: Not on file  . Ran Out of Food in the Last Year: Not on file  Transportation Needs:   . Lack of Transportation (Medical): Not on file  . Lack of Transportation (Non-Medical): Not on file  Physical Activity:   . Days of Exercise per Week: Not on file  . Minutes of Exercise per Session: Not on file  Stress:   . Feeling of Stress : Not on file  Social Connections:   . Frequency of Communication with Friends and Family: Not on file  . Frequency of Social Gatherings with Friends and Family:  Not on file  . Attends Religious Services: Not on file  . Active Member of Clubs or Organizations: Not on file  . Attends Archivist Meetings: Not on file  . Marital Status: Not on file  Intimate Partner Violence:   . Fear of Current or Ex-Partner: Not on file  . Emotionally Abused: Not on file  . Physically Abused: Not on file  . Sexually Abused: Not on file     PHYSICAL EXAM:  VS: BP (!) 143/85   Pulse 76   Ht 5\' 1"  (1.549 m)   Wt 170 lb (77.1 kg)   BMI 32.12 kg/m  Physical Exam Gen: NAD, alert, cooperative with exam, well-appearing ENT: normal lips, normal nasal mucosa,  Eye: normal EOM, normal conjunctiva and lids Skin: no rashes, no areas of induration  Neuro: normal tone, normal sensation to touch Psych:  normal insight, alert and oriented MSK:  Left ankle: Mild swelling over the lateral  malleolus. Limited plantarflexion and dorsiflexion. Mild tenderness to palpation over the lateral malleolus. Neurovascularly intact     ASSESSMENT & PLAN:   Closed fracture of distal lateral malleolus of left ankle Initially occurred on 12/5.  Has been doing well with mild tenderness over the lateral malleolus. -X-ray. -Counseled on home exercise therapy and supportive care. -Counseled on weaning out of the boot. -Follow-up if pain still occurring.

## 2019-10-05 NOTE — Patient Instructions (Signed)
Good to see you  Please continue ice  Please try the exercises Please continue to wear the boot for any long walks or pain in the ankle Please send me a message in MyChart with any questions or updates.  Please see [place] back as needed.   --Dr. Raeford Razor

## 2019-10-05 NOTE — Assessment & Plan Note (Signed)
Initially occurred on 12/5.  Has been doing well with mild tenderness over the lateral malleolus. -X-ray. -Counseled on home exercise therapy and supportive care. -Counseled on weaning out of the boot. -Follow-up if pain still occurring.

## 2019-10-11 ENCOUNTER — Telehealth: Payer: Self-pay | Admitting: Family Medicine

## 2019-10-11 NOTE — Telephone Encounter (Signed)
Spoke to patient and gave her x-ray results information as provided by the physician.

## 2019-10-11 NOTE — Telephone Encounter (Signed)
Patient returning call for xray results  

## 2019-10-11 NOTE — Telephone Encounter (Signed)
Left VM for patient. If she calls back please have her speak with a nurse/CMA and inform that her xray is showing healing of her fracture.   If any questions then please take the best time and phone number to call and I will try to call her back.   Rosemarie Ax, MD [facility] 10/11/2019, 8:56 AM

## 2020-02-12 IMAGING — DX DG ANKLE COMPLETE 3+V*L*
3 series · 3 of 3 positions shown · non-contrast
Comparison: 09/05/2019.

CLINICAL DATA: Left ankle fracture.  Subsequent encounter.

EXAM:
LEFT ANKLE COMPLETE - 3+ VIEW

[ankle ap]
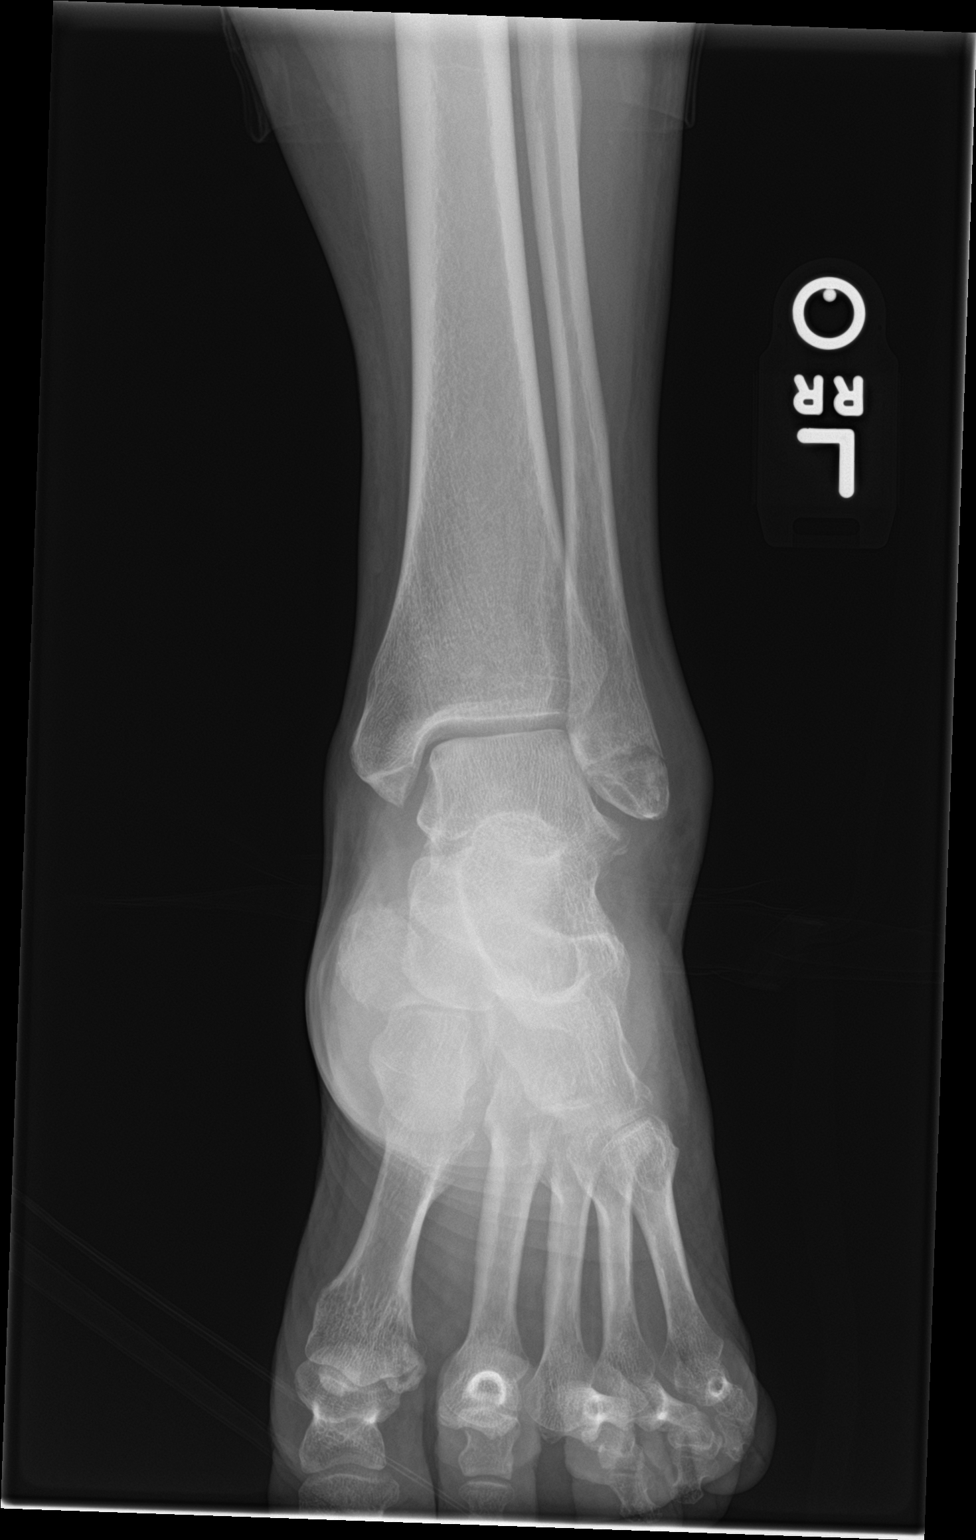

[ankle obl]
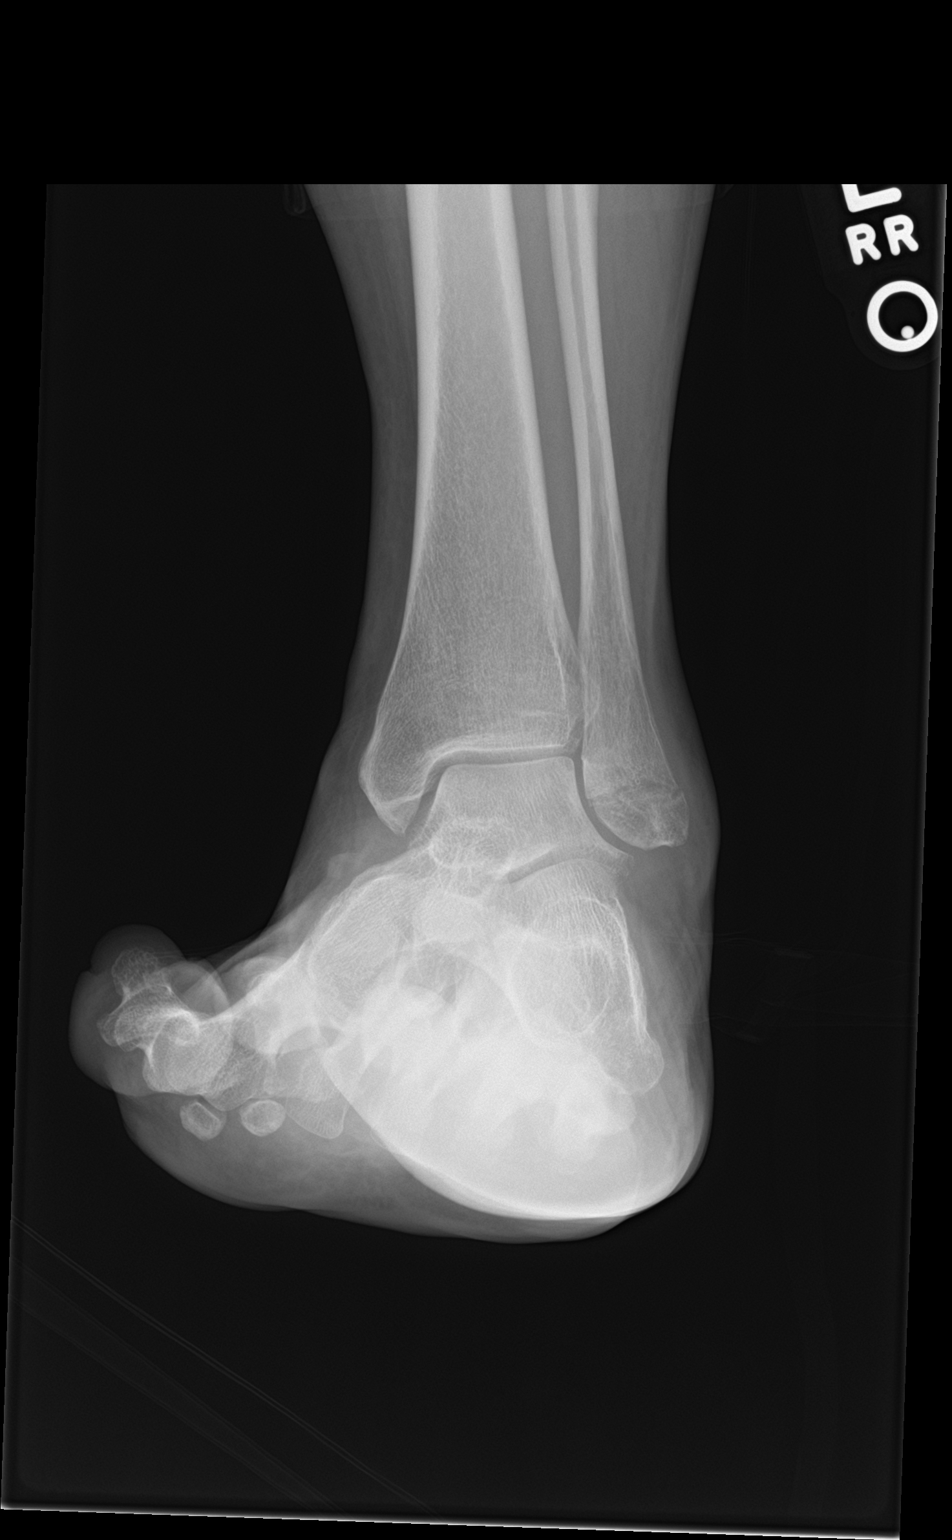

[ankle lat]
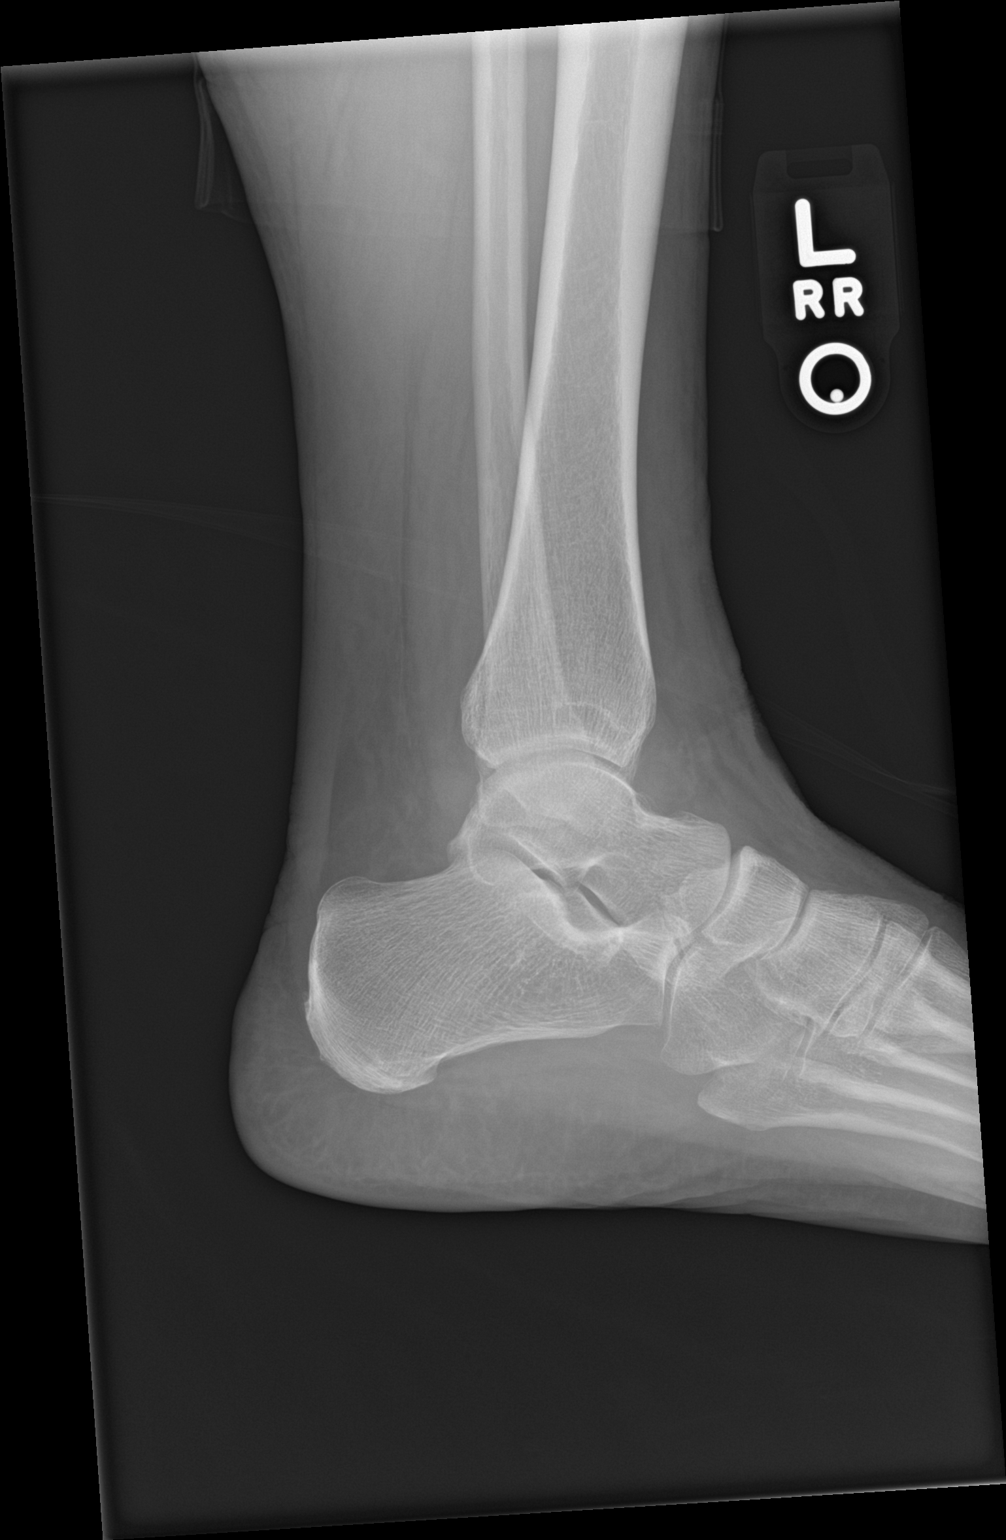

[3 of 3 positions shown; findings below may reference images not displayed]

FINDINGS: There is healing of a nondisplaced transversely oriented fracture of
the lateral malleolus. Fracture line remains evident but blurred.
Overlying soft tissue swelling. Ankle mortise is intact. No
additional evidence of a fracture.
IMPRESSION: Healing nondisplaced lateral malleolar fracture.

## 2020-07-17 ENCOUNTER — Telehealth: Payer: Self-pay | Admitting: Acute Care

## 2020-07-17 NOTE — Telephone Encounter (Signed)
LVM for Mr. Therriault to return my call [phone]

## 2020-07-18 NOTE — Telephone Encounter (Signed)
I called [employer] per Mr. Hassing request the plan that Mrs. Heizer has still has In network deductible at 1,[phone] only met 291.31 after deductible has been met she will still have to pay 30% coinsurance. This is a covered benefit Refer # [id number].  Mr. Witter stated that he would call me back and try and get the CT scheduled the beginning of December

## 2020-08-30 ENCOUNTER — Other Ambulatory Visit: Payer: Self-pay | Admitting: *Deleted

## 2020-08-30 DIAGNOSIS — Z87891 Personal history of nicotine dependence: Secondary | ICD-10-CM

## 2020-09-03 ENCOUNTER — Other Ambulatory Visit: Payer: Self-pay | Admitting: Family Medicine

## 2020-09-03 DIAGNOSIS — Z1231 Encounter for screening mammogram for malignant neoplasm of breast: Secondary | ICD-10-CM

## 2020-09-20 ENCOUNTER — Ambulatory Visit
Admission: RE | Admit: 2020-09-20 | Discharge: 2020-09-20 | Disposition: A | Discharge status: Home / Self Care | Payer: Self-pay | Source: Ambulatory Visit | Attending: Acute Care | Admitting: Acute Care

## 2020-09-20 ENCOUNTER — Other Ambulatory Visit: Payer: Self-pay

## 2020-09-20 DIAGNOSIS — Z87891 Personal history of nicotine dependence: Secondary | ICD-10-CM

## 2020-09-25 ENCOUNTER — Ambulatory Visit
Admission: RE | Admit: 2020-09-25 | Discharge: 2020-09-25 | Disposition: A | Discharge status: Home / Self Care | Payer: BC Managed Care – PPO | Source: Ambulatory Visit | Attending: Family Medicine | Admitting: Family Medicine

## 2020-09-25 ENCOUNTER — Other Ambulatory Visit: Payer: Self-pay

## 2020-09-25 ENCOUNTER — Ambulatory Visit: Payer: BC Managed Care – PPO

## 2020-09-25 ENCOUNTER — Other Ambulatory Visit: Payer: Self-pay | Admitting: *Deleted

## 2020-09-25 DIAGNOSIS — Z1231 Encounter for screening mammogram for malignant neoplasm of breast: Secondary | ICD-10-CM

## 2020-09-25 DIAGNOSIS — Z87891 Personal history of nicotine dependence: Secondary | ICD-10-CM

## 2020-09-25 NOTE — Progress Notes (Signed)
Please call patient and let them  know their  low dose Ct was read as a Lung RADS 2: nodules that are benign in appearance and behavior with a very low likelihood of becoming a clinically active cancer due to size or lack of growth. Recommendation per radiology is for a repeat LDCT in 12 months. .Please let them  know we will order and schedule their  annual screening scan for 08/2021. Please let them  know there was notation of CAD on their  scan.  Please remind the patient  that this is a non-gated exam therefore degree or severity of disease  cannot be determined. Please have them  follow up with their PCP regarding potential risk factor modification, dietary therapy or pharmacologic therapy if clinically indicated. Pt.  is  currently on statin therapy. Please place order for annual  screening scan for  08/2021 and fax results to PCP. Thanks so much. 

## 2020-09-25 NOTE — Progress Notes (Signed)
Please let patient know there was notation of Hepatic steatosis. This  is a term that describes the build up of fat in the liver. It is normal to have small amounts of fat in your liver, but when the proportion of liver cells that contain fat exceeds more than 5% it is indicative of early stage fatty liver.Treatment often involves reducing risk factors through a diet and exercise plan. It is generally a benign condition, but in a small percentage of patients it does require follow up. Please have the patient follow up with PCP regarding potential risk factor modification, dietary therapy or pharmacologic therapy if clinically indicated.   

## 2021-10-14 ENCOUNTER — Other Ambulatory Visit: Payer: Self-pay | Admitting: Family Medicine

## 2021-10-14 DIAGNOSIS — Z1231 Encounter for screening mammogram for malignant neoplasm of breast: Secondary | ICD-10-CM

## 2021-10-15 ENCOUNTER — Ambulatory Visit
Admission: RE | Admit: 2021-10-15 | Discharge: 2021-10-15 | Disposition: A | Discharge status: Home / Self Care | Payer: BC Managed Care – PPO | Source: Ambulatory Visit | Attending: Family Medicine | Admitting: Family Medicine

## 2021-10-15 ENCOUNTER — Other Ambulatory Visit: Payer: Self-pay

## 2021-10-15 DIAGNOSIS — Z87891 Personal history of nicotine dependence: Secondary | ICD-10-CM

## 2021-10-15 DIAGNOSIS — Z1231 Encounter for screening mammogram for malignant neoplasm of breast: Secondary | ICD-10-CM

## 2021-11-18 ENCOUNTER — Other Ambulatory Visit: Payer: Self-pay

## 2021-11-18 ENCOUNTER — Ambulatory Visit
Admission: RE | Admit: 2021-11-18 | Discharge: 2021-11-18 | Disposition: A | Discharge status: Home / Self Care | Payer: BC Managed Care – PPO | Source: Ambulatory Visit | Attending: Acute Care | Admitting: Acute Care

## 2021-11-18 DIAGNOSIS — Z87891 Personal history of nicotine dependence: Secondary | ICD-10-CM

## 2021-11-20 ENCOUNTER — Other Ambulatory Visit: Payer: Self-pay

## 2021-11-20 DIAGNOSIS — Z87891 Personal history of nicotine dependence: Secondary | ICD-10-CM

## 2021-11-24 ENCOUNTER — Ambulatory Visit (HOSPITAL_COMMUNITY)
Admission: EM | Admit: 2021-11-24 | Discharge: 2021-11-24 | Disposition: A | Discharge status: Home / Self Care | Payer: BC Managed Care – PPO | Attending: Internal Medicine | Admitting: Internal Medicine

## 2021-11-24 ENCOUNTER — Encounter (HOSPITAL_COMMUNITY): Payer: Self-pay | Admitting: *Deleted

## 2021-11-24 ENCOUNTER — Ambulatory Visit (INDEPENDENT_AMBULATORY_CARE_PROVIDER_SITE_OTHER): Payer: BC Managed Care – PPO

## 2021-11-24 ENCOUNTER — Other Ambulatory Visit: Payer: Self-pay

## 2021-11-24 DIAGNOSIS — M79671 Pain in right foot: Secondary | ICD-10-CM | POA: Diagnosis not present

## 2021-11-24 DIAGNOSIS — W109XXA Fall (on) (from) unspecified stairs and steps, initial encounter: Secondary | ICD-10-CM | POA: Diagnosis not present

## 2021-11-24 DIAGNOSIS — S82831A Other fracture of upper and lower end of right fibula, initial encounter for closed fracture: Secondary | ICD-10-CM | POA: Diagnosis not present

## 2021-11-24 MED ORDER — IBUPROFEN 800 MG PO TABS: 800.0000 mg | ORAL_TABLET | Freq: Three times a day (TID) | ORAL | 0 refills | Status: AC | PRN

## 2021-11-24 NOTE — ED Provider Notes (Signed)
?[facility] ? ? ? ?CSN: [id number] ?Arrival date & time: 11/24/21  1226 ? ? ?  ? ?History   ?Chief Complaint ?Chief Complaint  ?Patient presents with  ?? Fall  ? ? ?HPI ?Whitney [name] is a 61 y.o. female.  ? ?The patient is a 61 year old female who presents for right ankle pain.  States she was coming down her stairs and fell with the right foot/ankle rolling outward.  States she felt a "pop or felt the bone break when she rolled the foot over.  She complains of pain to the lateral aspect of the ankle with swelling.  She has taken 2 Aleve for her symptoms and applied ice since the incident occurred.  Patient reports that she broke her left ankle a couple years ago and she still has her walker boot from that injury. ? ? ?Fall ?This is a new problem. Exacerbated by: weight bearing, any movement. The symptoms are relieved by NSAIDs.  ? ?Past Medical History:  ?Diagnosis Date  ?? Cancer [facility])   ? breast-rt  ?? Depression   ?? Wears glasses   ? ? ?Patient Active Problem List  ? Diagnosis Date Noted  ?? Closed fracture of distal lateral malleolus of left ankle 09/05/2019  ? ? ?Past Surgical History:  ?Procedure Laterality Date  ?? BILATERAL SALPINGOOPHORECTOMY  2010  ? with hyst  ?? BREAST LUMPECTOMY WITH AXILLARY LYMPH NODE BIOPSY  2001  ? right  ?? BREAST RECONSTRUCTION  2001  ? right trans flap with mastectomy  ?? BREAST RECONSTRUCTION  2002  ? revision rt flap  ?? CYST EXCISION  2004  ? rt axilla  ?? DILATION AND CURETTAGE OF UTERUS    ?? HAND EXPLORATION    ? age 12-injury-repair trama-rt  ?? LAPAROSCOPIC VAGINAL HYSTERECTOMY  2010  ?? LIGAMENT REPAIR Right 11/02/2013  ? Procedure: REPAIR RADIAL COLLATERAL LIGAMENT METACARPAL PHALANGEAL RIGHT THUMB;  Surgeon: Wynonia Sours, MD;  Location: [facility];  Service: Orthopedics;  Laterality: Right;  ?? MASTECTOMY  2001  ? right-followed by reconstruction  ?? RECONSTRUCTION / CORRECTION OF NIPPLE / AEROLA  2003  ? ? ?OB History   ?No obstetric history  on file. ?  ? ? ? ?Home Medications   ? ?Prior to Admission medications   ?Medication Sig Start Date End Date Taking? Authorizing Provider  ?FLUoxetine (PROZAC) 40 MG capsule Take 40 mg by mouth daily.    [provider]  ?HYDROcodone-acetaminophen (NORCO) 5-325 MG per tablet Take 1 tablet by mouth every 6 (six) hours as needed for moderate pain. 11/02/13   Daryll Brod, MD  ?Multiple Vitamins-Minerals (MULTIVITAMIN WITH MINERALS) tablet Take 1 tablet by mouth daily.    [provider]  ? ? ?Family History ?History reviewed. No pertinent family history. ? ?Social History ?Social History  ? ?Tobacco Use  ?? Smoking status: Former  ?  Packs/day: 0.50  ?  Types: Cigarettes  ?? Smokeless tobacco: Former  ?  Quit date: 07/20/2014  ?? Tobacco comments:  ?  Encouraged to remain smoke free  ?Substance Use Topics  ?? Alcohol use: Yes  ?  Alcohol/week: 0.0 standard drinks  ?? Drug use: No  ? ? ? ?Allergies   ?Patient has no known allergies. ? ? ?Review of Systems ?Review of Systems  ?Constitutional: Negative.   ?Musculoskeletal:  Positive for joint swelling (right ankle).  ?Skin: Negative.   ?Psychiatric/Behavioral: Negative.    ? ? ?Physical Exam ?Triage Vital Signs ?ED Triage Vitals  ?  Enc Vitals Group  ?   BP 11/24/21 1300 (!) 160/91  ?   Pulse Rate 11/24/21 1300 83  ?   Resp 11/24/21 1300 18  ?   Temp 11/24/21 1300 98.4 ?F (36.9 ?C)  ?   Temp src --   ?   SpO2 11/24/21 1300 97 %  ?   Weight --   ?   Height --   ?   Head Circumference --   ?   Peak Flow --   ?   Pain Score 11/24/21 1254 5  ?   Pain Loc --   ?   Pain Edu? --   ?   Excl. in [place]? --   ? ?No data found. ? ?Updated Vital Signs ?BP (!) 160/91   Pulse 83   Temp 98.4 ?F (36.9 ?C)   Resp 18   SpO2 97%  ? ?Visual Acuity ?Right Eye Distance:   ?Left Eye Distance:   ?Bilateral Distance:   ? ?Right Eye Near:   ?Left Eye Near:    ?Bilateral Near:    ? ?Physical Exam ?Constitutional:   ?   General: She is not in acute distress. ?   Appearance: Normal  appearance.  ?Cardiovascular:  ?   Rate and Rhythm: Normal rate and regular rhythm.  ?Pulmonary:  ?   Effort: Pulmonary effort is normal.  ?   Breath sounds: Normal breath sounds.  ?Musculoskeletal:     ?   General: Swelling, tenderness and signs of injury present.  ?   Right lower leg: Swelling (swelling in the distal RLE), tenderness (tenderness in the distal RLE) and bony tenderness present.  ?   Right ankle: Swelling present. No ecchymosis. Tenderness present over the lateral malleolus. Decreased range of motion. Normal pulse.  ?   Left ankle: Normal.  ?Skin: ?   General: Skin is warm and dry.  ?Neurological:  ?   Mental Status: She is alert.  ? ? ? ?UC Treatments / Results  ?Labs ?(all labs ordered are listed, but only abnormal results are displayed) ?Labs Reviewed - No data to display ? ?EKG ? ? ?Radiology ?DG Ankle Complete Right ? ?Result Date: 11/24/2021 ?CLINICAL DATA:  [place] down steps.  Right ankle pain. EXAM: RIGHT ANKLE - COMPLETE 3+ VIEW COMPARISON:  None. FINDINGS: Nondisplaced, non comminuted fracture of the distal fibula extending at and just below the level of the ankle joint. No other fractures.  No bone lesions. Ankle joint is normally spaced and aligned. There is evidence of a joint effusion. Lateral soft tissue swelling. IMPRESSION: 1. Nondisplaced, non comminuted fracture of the distal fibula. Normally aligned ankle joint. Electronically Signed   By: Lajean Manes M.D.   On: 11/24/2021 13:28  ? ?DG Foot Complete Right ? ?Result Date: 11/24/2021 ?CLINICAL DATA:  Fall down stairs, pain EXAM: RIGHT FOOT COMPLETE - 3+ VIEW COMPARISON:  None. FINDINGS: There is no evidence of fracture or dislocation. Ankle joint effusion. There is no evidence of arthropathy or other focal bone abnormality. Soft tissue edema anteriorly over the ankle. IMPRESSION: 1. No fracture or dislocation of the right foot. Consider dedicated radiographs of the ankle per report of ankle pain. 2. Ankle joint effusion. Soft tissue  edema anteriorly over the ankle. Electronically Signed   By: Delanna Ahmadi M.D.   On: 11/24/2021 13:23   ? ?Procedures ?Procedures (including critical care time) ? ?Medications Ordered in UC ?Medications - No data to display ? ?Initial Impression / Assessment and Plan /  UC Course  ?I have reviewed the triage vital signs and the nursing notes. ? ?Pertinent labs & imaging results that were available during my care of the patient were reviewed by me and considered in my medical decision making (see chart for details). ? ?Patient with a nondisplaced distal fibula fracture.  Patient would like to use the cam boot that she had previously until she was able to be seen by Ortho.  We will refer the patient to [facility] care.  Phone number will be provided to the patient.  In the interim, patient to continue to apply ice, on for 20 minutes, off for 1 hour then repeat as much as possible.  Also elevation to the right lower extremity.  Patient has been taking Aleve for pain which she feels is controlled, but we will also provide a prescription for ibuprofen 800 mg to take every 8 hours, this will help with pain, swelling, and inflammation. ?Final Clinical Impressions(s) / UC Diagnoses  ? ?Final diagnoses:  ?None  ? ?Discharge Instructions   ?None ?  ? ?ED Prescriptions   ?None ?  ? ?PDMP not reviewed this encounter. ?  ?Tish Men, NP ?11/24/21 1511 ? ?

## 2021-11-24 NOTE — ED Triage Notes (Signed)
Pt reports falling down stairs today and has Rt ankle pain. ?

## 2021-11-24 NOTE — Discharge Instructions (Addendum)
X-rays show you have a fracture to your distal fibula. ?Take medication as prescribed. ?Wear your cam boot continuously until seen by Ortho. ?RICE therapy.  Apply ice for 20 minutes, take off 1 hour, then repeat. ?Call [facility] care at [phone] first thing on 3/6. ?Follow-up as needed. ?

## 2021-11-25 ENCOUNTER — Ambulatory Visit: Payer: BC Managed Care – PPO | Admitting: Orthopedic Surgery

## 2021-11-25 ENCOUNTER — Encounter: Payer: Self-pay | Admitting: Orthopedic Surgery

## 2021-11-25 DIAGNOSIS — S82891A Other fracture of right lower leg, initial encounter for closed fracture: Secondary | ICD-10-CM | POA: Diagnosis not present

## 2021-11-25 NOTE — Progress Notes (Signed)
? ?Office Visit Note ?  ?Patient: Whitney [name]           ?Date of Birth: [birth year]           ?MRN: [id number] ?Visit Date: 11/25/2021 ?             ?Requested by: Harlan Stains, MD ?[address] ?[address] ?[place],  [place] [address] ?PCP: Harlan Stains, MD ? ?Chief Complaint  ?Patient presents with  ? Right Ankle - Pain  ? Right Foot - Pain  ? ? ? ? ?HPI: ?Patient is a 61 year old woman who presents for initial evaluation for Weber a right fibular fracture.  She went to urgent care yesterday was placed in a fracture boot.  Patient states that she rolled her ankle going down steps.  Patient complains of pain with weightbearing. ? ?Assessment & Plan: ?Visit Diagnoses:  ?1. Closed fracture of right ankle, initial encounter   ? ? ?Plan: Continue with protected weightbearing in the cam walker recommended a kneeling scooter.  Repeat three-view radiographs of the right ankle at follow-up.  Anticipate surgical intervention would not be necessary. ? ?Follow-Up Instructions: Return in about 2 weeks (around 12/09/2021).  ? ?Ortho Exam ? ?Patient is alert, oriented, no adenopathy, well-dressed, normal affect, normal respiratory effort. ?Examination patient has good pulses.  Review of the radiographs shows a nondisplaced Weber a fibular fracture of the right ankle the syndesmosis is not widened the mortise is congruent. ? ?Imaging: ?No results found. ?No images are attached to the encounter. ? ?Labs: ?No results found for: HGBA1C, ESRSEDRATE, CRP, LABURIC, REPTSTATUS, GRAMSTAIN, CULT, LABORGA ? ? ?Lab Results  ?Component Value Date  ? ALBUMIN 4.3 07/16/2009  ? ? ?No results found for: MG ?No results found for: VD25OH ? ?No results found for: PREALBUMIN ?CBC EXTENDED Latest Ref Rng & Units 11/02/2013 07/18/2009 07/16/2009  ?WBC 4.0 - 10.5 K/uL - 12.0(H) 6.0  ?RBC 3.87 - 5.11 MIL/uL - 3.61(L) 4.84  ?HGB 12.0 - 15.0 g/dL 15.1(H) 11.4 DELTA CHECK NOTED(L) 15.2(H)  ?HCT 36.0 - 46.0 % - 33.9(L) 45.2  ?PLT 150 - 400 K/uL - 222  270  ? ? ? ?There is no height or weight on file to calculate BMI. ? ?Orders:  ?No orders of the defined types were placed in this encounter. ? ?No orders of the defined types were placed in this encounter. ? ? ? Procedures: ?No procedures performed ? ?Clinical Data: ?No additional findings. ? ?ROS: ? ?All other systems negative, except as noted in the HPI. ?Review of Systems ? ?Objective: ?Vital Signs: There were no vitals taken for this visit. ? ?Specialty Comments:  ?No specialty comments available. ? ?PMFS History: ?Patient Active Problem List  ? Diagnosis Date Noted  ? Closed fracture of distal lateral malleolus of left ankle 09/05/2019  ? ?Past Medical History:  ?Diagnosis Date  ? Cancer [facility])   ? breast-rt  ? Depression   ? Wears glasses   ?  ?History reviewed. No pertinent family history.  ?Past Surgical History:  ?Procedure Laterality Date  ? BILATERAL SALPINGOOPHORECTOMY  2010  ? with hyst  ? BREAST LUMPECTOMY WITH AXILLARY LYMPH NODE BIOPSY  2001  ? right  ? BREAST RECONSTRUCTION  2001  ? right trans flap with mastectomy  ? BREAST RECONSTRUCTION  2002  ? revision rt flap  ? CYST EXCISION  2004  ? rt axilla  ? DILATION AND CURETTAGE OF UTERUS    ? HAND EXPLORATION    ? age 18-injury-repair  trama-rt  ? LAPAROSCOPIC VAGINAL HYSTERECTOMY  2010  ? LIGAMENT REPAIR Right 11/02/2013  ? Procedure: REPAIR RADIAL COLLATERAL LIGAMENT METACARPAL PHALANGEAL RIGHT THUMB;  Surgeon: Wynonia Sours, MD;  Location: [facility];  Service: Orthopedics;  Laterality: Right;  ? MASTECTOMY  2001  ? right-followed by reconstruction  ? RECONSTRUCTION / CORRECTION OF NIPPLE / AEROLA  2003  ? ?Social History  ? ?Occupational History  ? Not on file  ?Tobacco Use  ? Smoking status: Former  ?  Packs/day: 0.50  ?  Types: Cigarettes  ? Smokeless tobacco: Former  ?  Quit date: 07/20/2014  ? Tobacco comments:  ?  Encouraged to remain smoke free  ?Substance and Sexual Activity  ? Alcohol use: Yes  ?  Alcohol/week: 0.0 standard  drinks  ? Drug use: No  ? Sexual activity: Not on file  ? ? ? ? ? ?

## 2021-12-10 ENCOUNTER — Ambulatory Visit (INDEPENDENT_AMBULATORY_CARE_PROVIDER_SITE_OTHER): Payer: BC Managed Care – PPO

## 2021-12-10 ENCOUNTER — Ambulatory Visit (INDEPENDENT_AMBULATORY_CARE_PROVIDER_SITE_OTHER): Payer: BC Managed Care – PPO | Admitting: Orthopedic Surgery

## 2021-12-10 DIAGNOSIS — S82891A Other fracture of right lower leg, initial encounter for closed fracture: Secondary | ICD-10-CM | POA: Diagnosis not present

## 2021-12-16 ENCOUNTER — Encounter: Payer: Self-pay | Admitting: Orthopedic Surgery

## 2021-12-16 NOTE — Progress Notes (Signed)
? ?Office Visit Note ?  ?Patient: Whitney [name]           ?Date of Birth: 29-May-1961           ?MRN: [id number] ?Visit Date: 12/10/2021 ?             ?Requested by: Harlan Stains, MD ?[address] ?[address] ?[place],  [place] [address] ?PCP: Harlan Stains, MD ? ?Chief Complaint  ?Patient presents with  ? Right Ankle - Follow-up  ? ? ? ? ?HPI: ?Patient is a 61 year old woman who is 2 weeks status post nondisplaced Weber a right ankle fibular fracture.  She is currently in a cam walker and a kneeling scooter. ? ?Assessment & Plan: ?Visit Diagnoses:  ?1. Closed fracture of right ankle, initial encounter   ? ? ?Plan: Patient will continue with the protected weightbearing.  At follow-up in 2 weeks we will repeat three-view radiographs of the right ankle and anticipate full weightbearing in the boot. ? ?Follow-Up Instructions: Return in about 2 weeks (around 12/24/2021).  ? ?Ortho Exam ? ?Patient is alert, oriented, no adenopathy, well-dressed, normal affect, normal respiratory effort. ?Examination patient does have ecchymosis and bruising laterally there is swelling there are no fracture blisters.  Her foot is warm no dystrophic changes. ? ?Imaging: ?No results found. ?No images are attached to the encounter. ? ?Labs: ?No results found for: HGBA1C, ESRSEDRATE, CRP, LABURIC, REPTSTATUS, GRAMSTAIN, CULT, LABORGA ? ? ?Lab Results  ?Component Value Date  ? ALBUMIN 4.3 07/16/2009  ? ? ?No results found for: MG ?No results found for: VD25OH ? ?No results found for: PREALBUMIN ? ?  Latest Ref Rng & Units 11/02/2013  ? 11:41 AM 07/18/2009  ?  5:55 AM 07/16/2009  ? 11:05 AM  ?CBC EXTENDED  ?WBC 4.0 - 10.5 K/uL  12.0   6.0    ?RBC 3.87 - 5.11 MIL/uL  3.61   4.84    ?Hemoglobin 12.0 - 15.0 g/dL 15.1   11.4 DELTA CHECK NOTED   15.2    ?HCT 36.0 - 46.0 %  33.9   45.2    ?Platelets 150 - 400 K/uL  222   270    ? ? ? ?There is no height or weight on file to calculate BMI. ? ?Orders:  ?Orders Placed This Encounter  ?Procedures  ? XR  Ankle Complete Right  ? ?No orders of the defined types were placed in this encounter. ? ? ? Procedures: ?No procedures performed ? ?Clinical Data: ?No additional findings. ? ?ROS: ? ?All other systems negative, except as noted in the HPI. ?Review of Systems ? ?Objective: ?Vital Signs: There were no vitals taken for this visit. ? ?Specialty Comments:  ?No specialty comments available. ? ?PMFS History: ?Patient Active Problem List  ? Diagnosis Date Noted  ? Closed fracture of distal lateral malleolus of left ankle 09/05/2019  ? ?Past Medical History:  ?Diagnosis Date  ? Cancer [facility])   ? breast-rt  ? Depression   ? Wears glasses   ?  ?History reviewed. No pertinent family history.  ?Past Surgical History:  ?Procedure Laterality Date  ? BILATERAL SALPINGOOPHORECTOMY  2010  ? with hyst  ? BREAST LUMPECTOMY WITH AXILLARY LYMPH NODE BIOPSY  2001  ? right  ? BREAST RECONSTRUCTION  2001  ? right trans flap with mastectomy  ? BREAST RECONSTRUCTION  2002  ? revision rt flap  ? CYST EXCISION  2004  ? rt axilla  ? DILATION AND CURETTAGE OF UTERUS    ?  HAND EXPLORATION    ? age 83-injury-repair trama-rt  ? LAPAROSCOPIC VAGINAL HYSTERECTOMY  2010  ? LIGAMENT REPAIR Right 11/02/2013  ? Procedure: REPAIR RADIAL COLLATERAL LIGAMENT METACARPAL PHALANGEAL RIGHT THUMB;  Surgeon: Wynonia Sours, MD;  Location: [facility];  Service: Orthopedics;  Laterality: Right;  ? MASTECTOMY  2001  ? right-followed by reconstruction  ? RECONSTRUCTION / CORRECTION OF NIPPLE / AEROLA  2003  ? ?Social History  ? ?Occupational History  ? Not on file  ?Tobacco Use  ? Smoking status: Former  ?  Packs/day: 0.50  ?  Types: Cigarettes  ? Smokeless tobacco: Former  ?  Quit date: 07/20/2014  ? Tobacco comments:  ?  Encouraged to remain smoke free  ?Substance and Sexual Activity  ? Alcohol use: Yes  ?  Alcohol/week: 0.0 standard drinks  ? Drug use: No  ? Sexual activity: Not on file  ? ? ? ? ? ?

## 2021-12-24 ENCOUNTER — Ambulatory Visit (INDEPENDENT_AMBULATORY_CARE_PROVIDER_SITE_OTHER): Payer: BC Managed Care – PPO

## 2021-12-24 ENCOUNTER — Ambulatory Visit (INDEPENDENT_AMBULATORY_CARE_PROVIDER_SITE_OTHER): Payer: BC Managed Care – PPO | Admitting: Orthopedic Surgery

## 2021-12-24 DIAGNOSIS — S82891A Other fracture of right lower leg, initial encounter for closed fracture: Secondary | ICD-10-CM

## 2021-12-25 ENCOUNTER — Encounter: Payer: Self-pay | Admitting: Orthopedic Surgery

## 2021-12-25 NOTE — Progress Notes (Signed)
? ?Office Visit Note ?  ?Patient: Whitney [name] Seen           ?Date of Birth: 1961-08-29           ?MRN: [id number] ?Visit Date: 12/24/2021 ?             ?Requested by: Harlan Stains, MD ?[address] ?[address] ?[place],  [place] [address] ?PCP: Harlan Stains, MD ? ?Chief Complaint  ?Patient presents with  ? Right Ankle - Fracture, Follow-up  ? ? ? ? ?HPI: ?Patient is a 61 year old woman who is 4 weeks status post nondisplaced Weber a fibular fracture on the right she has been using a fracture boot. ? ?Assessment & Plan: ?Visit Diagnoses:  ?1. Closed fracture of right ankle, initial encounter   ? ? ?Plan: Wean off the kneeling scooter start progressing with weightbearing as tolerated the fracture boot continue to work on ankle range of motion.  Follow-up for radiographs prior to leaving the country for vacation. ? ?Follow-Up Instructions: Return in about 2 weeks (around 01/07/2022).  ? ?Ortho Exam ? ?Patient is alert, oriented, no adenopathy, well-dressed, normal affect, normal respiratory effort. ?Examination the fracture site is nontender to palpation anterior drawer is stable there is no effusion no ecchymosis or bruising. ? ?Imaging: ?No results found. ?No images are attached to the encounter. ? ?Labs: ?No results found for: HGBA1C, ESRSEDRATE, CRP, LABURIC, REPTSTATUS, GRAMSTAIN, CULT, LABORGA ? ? ?Lab Results  ?Component Value Date  ? ALBUMIN 4.3 07/16/2009  ? ? ?No results found for: MG ?No results found for: VD25OH ? ?No results found for: PREALBUMIN ? ?  Latest Ref Rng & Units 11/02/2013  ? 11:41 AM 07/18/2009  ?  5:55 AM 07/16/2009  ? 11:05 AM  ?CBC EXTENDED  ?WBC 4.0 - 10.5 K/uL  12.0   6.0    ?RBC 3.87 - 5.11 MIL/uL  3.61   4.84    ?Hemoglobin 12.0 - 15.0 g/dL 15.1   11.4 DELTA CHECK NOTED   15.2    ?HCT 36.0 - 46.0 %  33.9   45.2    ?Platelets 150 - 400 K/uL  222   270    ? ? ? ?There is no height or weight on file to calculate BMI. ? ?Orders:  ?Orders Placed This Encounter  ?Procedures  ? XR Ankle  Complete Right  ? ?No orders of the defined types were placed in this encounter. ? ? ? Procedures: ?No procedures performed ? ?Clinical Data: ?No additional findings. ? ?ROS: ? ?All other systems negative, except as noted in the HPI. ?Review of Systems ? ?Objective: ?Vital Signs: There were no vitals taken for this visit. ? ?Specialty Comments:  ?No specialty comments available. ? ?PMFS History: ?Patient Active Problem List  ? Diagnosis Date Noted  ? Closed fracture of distal lateral malleolus of left ankle 09/05/2019  ? ?Past Medical History:  ?Diagnosis Date  ? Cancer [facility])   ? breast-rt  ? Depression   ? Wears glasses   ?  ?History reviewed. No pertinent family history.  ?Past Surgical History:  ?Procedure Laterality Date  ? BILATERAL SALPINGOOPHORECTOMY  2010  ? with hyst  ? BREAST LUMPECTOMY WITH AXILLARY LYMPH NODE BIOPSY  2001  ? right  ? BREAST RECONSTRUCTION  2001  ? right trans flap with mastectomy  ? BREAST RECONSTRUCTION  2002  ? revision rt flap  ? CYST EXCISION  2004  ? rt axilla  ? DILATION AND CURETTAGE OF UTERUS    ?  HAND EXPLORATION    ? age 34-injury-repair trama-rt  ? LAPAROSCOPIC VAGINAL HYSTERECTOMY  2010  ? LIGAMENT REPAIR Right 11/02/2013  ? Procedure: REPAIR RADIAL COLLATERAL LIGAMENT METACARPAL PHALANGEAL RIGHT THUMB;  Surgeon: Wynonia Sours, MD;  Location: [facility];  Service: Orthopedics;  Laterality: Right;  ? MASTECTOMY  2001  ? right-followed by reconstruction  ? RECONSTRUCTION / CORRECTION OF NIPPLE / AEROLA  2003  ? ?Social History  ? ?Occupational History  ? Not on file  ?Tobacco Use  ? Smoking status: Former  ?  Packs/day: 0.50  ?  Types: Cigarettes  ? Smokeless tobacco: Former  ?  Quit date: 07/20/2014  ? Tobacco comments:  ?  Encouraged to remain smoke free  ?Substance and Sexual Activity  ? Alcohol use: Yes  ?  Alcohol/week: 0.0 standard drinks  ? Drug use: No  ? Sexual activity: Not on file  ? ? ? ? ? ?

## 2022-01-09 ENCOUNTER — Ambulatory Visit: Payer: BC Managed Care – PPO | Admitting: Orthopedic Surgery

## 2022-01-09 ENCOUNTER — Ambulatory Visit (INDEPENDENT_AMBULATORY_CARE_PROVIDER_SITE_OTHER): Payer: BC Managed Care – PPO

## 2022-01-09 DIAGNOSIS — S82891A Other fracture of right lower leg, initial encounter for closed fracture: Secondary | ICD-10-CM

## 2022-01-21 ENCOUNTER — Encounter: Payer: Self-pay | Admitting: Orthopedic Surgery

## 2022-01-21 NOTE — Progress Notes (Signed)
? ?Office Visit Note ?  ?Patient: Whitney [name]           ?Date of Birth: 10/26/1960           ?MRN: [id number] ?Visit Date: 01/09/2022 ?             ?Requested by: Whitney Stains, MD ?[address] ?[address] ?[place],  [place] [address] ?PCP: Whitney Stains, MD ? ?Chief Complaint  ?Patient presents with  ? Right Ankle - Fracture, Follow-up  ? ? ? ? ?HPI: ?Patient is a 61 year old woman who is [name] in follow-up for Weber a fibular fracture right ankle she is weightbearing as tolerated in a fracture boot. ? ?Assessment & Plan: ?Visit Diagnoses:  ?1. Closed fracture of right ankle, initial encounter   ? ? ?Plan: Patient will increase her activities as tolerated and advance to regular shoewear she is leaving for a trip.  We will place her in an ASO. ? ?Follow-Up Instructions: No follow-ups on file.  ? ?Ortho Exam ? ?Patient is alert, oriented, no adenopathy, well-dressed, normal affect, normal respiratory effort. ?Examination patient has no pain with weightbearing.  There is no tenderness to palpation along the fibula.  Anterior drawer is stable.  She has good ankle and subtalar motion. ? ?Imaging: ?No results found. ?No images are attached to the encounter. ? ?Labs: ?No results found for: HGBA1C, ESRSEDRATE, CRP, LABURIC, REPTSTATUS, GRAMSTAIN, CULT, LABORGA ? ? ?Lab Results  ?Component Value Date  ? ALBUMIN 4.3 07/16/2009  ? ? ?No results found for: MG ?No results found for: VD25OH ? ?No results found for: PREALBUMIN ? ?  Latest Ref Rng & Units 11/02/2013  ? 11:41 AM 07/18/2009  ?  5:55 AM 07/16/2009  ? 11:05 AM  ?CBC EXTENDED  ?WBC 4.0 - 10.5 K/uL  12.0   6.0    ?RBC 3.87 - 5.11 MIL/uL  3.61   4.84    ?Hemoglobin 12.0 - 15.0 g/dL 15.1   11.4 DELTA CHECK NOTED   15.2    ?HCT 36.0 - 46.0 %  33.9   45.2    ?Platelets 150 - 400 K/uL  222   270    ? ? ? ?There is no height or weight on file to calculate BMI. ? ?Orders:  ?Orders Placed This Encounter  ?Procedures  ? XR Ankle Complete Right  ? ?No orders of the defined  types were placed in this encounter. ? ? ? Procedures: ?No procedures performed ? ?Clinical Data: ?No additional findings. ? ?ROS: ? ?All other systems negative, except as noted in the HPI. ?Review of Systems ? ?Objective: ?Vital Signs: There were no vitals taken for this visit. ? ?Specialty Comments:  ?No specialty comments available. ? ?PMFS History: ?Patient Active Problem List  ? Diagnosis Date Noted  ? Closed fracture of distal lateral malleolus of left ankle 09/05/2019  ? ?Past Medical History:  ?Diagnosis Date  ? Cancer [facility])   ? breast-rt  ? Depression   ? Wears glasses   ?  ?History reviewed. No pertinent family history.  ?Past Surgical History:  ?Procedure Laterality Date  ? BILATERAL SALPINGOOPHORECTOMY  2010  ? with hyst  ? BREAST LUMPECTOMY WITH AXILLARY LYMPH NODE BIOPSY  2001  ? right  ? BREAST RECONSTRUCTION  2001  ? right trans flap with mastectomy  ? BREAST RECONSTRUCTION  2002  ? revision rt flap  ? CYST EXCISION  2004  ? rt axilla  ? DILATION AND CURETTAGE OF UTERUS    ?  HAND EXPLORATION    ? age 8-injury-repair trama-rt  ? LAPAROSCOPIC VAGINAL HYSTERECTOMY  2010  ? LIGAMENT REPAIR Right 11/02/2013  ? Procedure: REPAIR RADIAL COLLATERAL LIGAMENT METACARPAL PHALANGEAL RIGHT THUMB;  Surgeon: Wynonia Sours, MD;  Location: [facility] [facility];  Service: Orthopedics;  Laterality: Right;  ? MASTECTOMY  2001  ? right-followed by reconstruction  ? RECONSTRUCTION / CORRECTION OF NIPPLE / AEROLA  2003  ? ?Social History  ? ?Occupational History  ? Not on file  ?Tobacco Use  ? Smoking status: Former  ?  Packs/day: 0.50  ?  Types: Cigarettes  ? Smokeless tobacco: Former  ?  Quit date: 07/20/2014  ? Tobacco comments:  ?  Encouraged to remain smoke free  ?Substance and Sexual Activity  ? Alcohol use: Yes  ?  Alcohol/week: 0.0 standard drinks  ? Drug use: No  ? Sexual activity: Not on file  ? ? ? ? ? ?

## 2022-02-06 ENCOUNTER — Ambulatory Visit: Payer: BC Managed Care – PPO | Admitting: Orthopedic Surgery

## 2022-02-10 ENCOUNTER — Ambulatory Visit: Payer: BC Managed Care – PPO | Admitting: Orthopedic Surgery

## 2022-02-10 ENCOUNTER — Encounter: Payer: Self-pay | Admitting: Orthopedic Surgery

## 2022-02-10 ENCOUNTER — Ambulatory Visit (INDEPENDENT_AMBULATORY_CARE_PROVIDER_SITE_OTHER): Payer: BC Managed Care – PPO

## 2022-02-10 DIAGNOSIS — S82891A Other fracture of right lower leg, initial encounter for closed fracture: Secondary | ICD-10-CM

## 2022-02-10 NOTE — Progress Notes (Signed)
Office Visit Note   Patient: Albesa Seen           Date of Birth: 05/20/1961           MRN: [id number] Visit Date: 02/10/2022              Requested by: Harlan Stains, MD [address] [place],  [place] [address] PCP: Harlan Stains, MD  Chief Complaint  Patient presents with   Right Ankle - Fracture      HPI: Patient is a 61 year old woman who presents in follow-up for right ankle Weber a fibular fracture.  Patient has been increasing her activities using an ankle stabilizing orthosis.  Patient states she has some weakness.  Assessment & Plan: Visit Diagnoses:  1. Closed fracture of right ankle, initial encounter     Plan: We will set her up for physical therapy for range of motion strengthening and edema control.  Follow-Up Instructions: Return if symptoms worsen or fail to improve.   Ortho Exam  Patient is alert, oriented, no adenopathy, well-dressed, normal affect, normal respiratory effort. Examination patient fibula and deltoid are nontender to palpation anterior drawer stable she is tender to palpation over the anterior talofibular ligament.  The ankle fracture is not tender.  Patient still has some swelling and no ulcers.  Imaging: XR Ankle Complete Right  Result Date: 02/10/2022 Three-view radiographs of the right ankle shows a congruent well aligned nondisplaced Weber a fibular fracture.  Interval healing.  No images are attached to the encounter.  Labs: No results found for: HGBA1C, ESRSEDRATE, CRP, LABURIC, REPTSTATUS, GRAMSTAIN, CULT, LABORGA   Lab Results  Component Value Date   ALBUMIN 4.3 07/16/2009    No results found for: MG No results found for: VD25OH  No results found for: PREALBUMIN    Latest Ref Rng & Units 11/02/2013   11:41 AM 07/18/2009    5:55 AM 07/16/2009   11:05 AM  CBC EXTENDED  WBC 4.0 - 10.5 K/uL  12.0   6.0    RBC 3.87 - 5.11 MIL/uL  3.61   4.84    Hemoglobin 12.0 - 15.0 g/dL 15.1   11.4 DELTA CHECK  NOTED   15.2    HCT 36.0 - 46.0 %  33.9   45.2    Platelets 150 - 400 K/uL  222   270       There is no height or weight on file to calculate BMI.  Orders:  Orders Placed This Encounter  Procedures   XR Ankle Complete Right   No orders of the defined types were placed in this encounter.    Procedures: No procedures performed  Clinical Data: No additional findings.  ROS:  All other systems negative, except as noted in the HPI. Review of Systems  Objective: Vital Signs: There were no vitals taken for this visit.  Specialty Comments:  No specialty comments available.  PMFS History: Patient Active Problem List   Diagnosis Date Noted   Closed fracture of distal lateral malleolus of left ankle 09/05/2019   Past Medical History:  Diagnosis Date   Cancer ([place])    breast-rt   Depression    Wears glasses     History reviewed. No pertinent family history.  Past Surgical History:  Procedure Laterality Date   BILATERAL SALPINGOOPHORECTOMY  2010   with hyst   BREAST LUMPECTOMY WITH AXILLARY LYMPH NODE BIOPSY  2001   right   BREAST RECONSTRUCTION  2001   right trans  flap with mastectomy   BREAST RECONSTRUCTION  2002   revision rt flap   CYST EXCISION  2004   rt axilla   DILATION AND CURETTAGE OF UTERUS     HAND EXPLORATION     age 66-injury-repair trama-rt   LAPAROSCOPIC VAGINAL HYSTERECTOMY  2010   LIGAMENT REPAIR Right 11/02/2013   Procedure: REPAIR RADIAL COLLATERAL LIGAMENT METACARPAL PHALANGEAL RIGHT THUMB;  Surgeon: Wynonia Sours, MD;  Location: [facility];  Service: Orthopedics;  Laterality: Right;   MASTECTOMY  2001   right-followed by reconstruction   RECONSTRUCTION / [place] / AEROLA  2003   Social History   Occupational History   Not on file  Tobacco Use   Smoking status: Former    Packs/day: 0.50    Types: Cigarettes   Smokeless tobacco: Former    Quit date: 07/20/2014   Tobacco comments:    Encouraged to remain  smoke free  Substance and Sexual Activity   Alcohol use: Yes    Alcohol/week: 0.0 standard drinks   Drug use: No   Sexual activity: Not on file

## 2022-02-12 ENCOUNTER — Ambulatory Visit: Payer: BC Managed Care – PPO | Admitting: Rehabilitative and Restorative Service Providers"

## 2022-02-12 NOTE — Therapy (Incomplete)
OUTPATIENT PHYSICAL THERAPY LOWER EXTREMITY EVALUATION   Patient Name: Whitney [name] MRN: [id number] DOB:1961/06/23, 61 y.o., female Today's Date: 02/12/2022    Past Medical History:  Diagnosis Date   Cancer [facility])    breast-rt   Depression    Wears glasses    Past Surgical History:  Procedure Laterality Date   BILATERAL SALPINGOOPHORECTOMY  2010   with hyst   BREAST LUMPECTOMY WITH AXILLARY LYMPH NODE BIOPSY  2001   right   BREAST RECONSTRUCTION  2001   right trans flap with mastectomy   BREAST RECONSTRUCTION  2002   revision rt flap   CYST EXCISION  2004   rt axilla   DILATION AND CURETTAGE OF UTERUS     HAND EXPLORATION     age 45-injury-repair trama-rt   LAPAROSCOPIC VAGINAL HYSTERECTOMY  2010   LIGAMENT REPAIR Right 11/02/2013   Procedure: REPAIR RADIAL COLLATERAL LIGAMENT METACARPAL PHALANGEAL RIGHT THUMB;  Surgeon: Wynonia Sours, MD;  Location: [facility];  Service: Orthopedics;  Laterality: Right;   MASTECTOMY  2001   right-followed by reconstruction   RECONSTRUCTION / [place] / AEROLA  2003   Patient Active Problem List   Diagnosis Date Noted   Closed fracture of distal lateral malleolus of left ankle 09/05/2019    PCP: Harlan Stains, MD  REFERRING PROVIDER: Newt Minion, MD  REFERRING DIAG: [phone] (ICD-10-CM) - Closed fracture of right ankle, initial encounter   THERAPY DIAG:  No diagnosis found.  Rationale for Evaluation and Treatment Rehabilitation  ONSET DATE: ***  SUBJECTIVE:   SUBJECTIVE STATEMENT: ***  PERTINENT HISTORY: H/O R breast cancer, R thumb surgery, L ankle lateral malleolus fracture  PAIN:  Are you having pain? Yes: NPRS scale: ***/10 Pain location: *** Pain description: *** Aggravating factors: *** Relieving factors: ***  PRECAUTIONS: {Therapy precautions:24002}  WEIGHT BEARING RESTRICTIONS No  FALLS:  Has patient fallen in last 6 months? {fallsyesno:27318}  LIVING ENVIRONMENT: Lives  with: {[facility] lives with:25569::"lives with their family"} Lives in: {Lives in:25570} Stairs: {opstairs:27293} Has following equipment at home: {Assistive devices:23999}  OCCUPATION: ***  PLOF: {PLOF:24004}  PATIENT GOALS ***   OBJECTIVE:   DIAGNOSTIC FINDINGS: Three-view radiographs of the right ankle shows a congruent well aligned  nondisplaced Weber a fibular fracture.  Interval healing.   PATIENT SURVEYS:  FOTO ***  COGNITION:  Overall cognitive status: Within functional limits for tasks assessed     SENSATION: {sensation:27233}  EDEMA:  {edema:24020}  MUSCLE LENGTH: Hamstrings: Right *** deg; Left *** deg Marcello Moores test: Right *** deg; Left *** deg  POSTURE: {posture:25561}  PALPATION: ***  LOWER EXTREMITY ROM:  Active ROM Right eval Left eval  Hip flexion    Hip extension    Hip abduction    Hip adduction    Hip internal rotation    Hip external rotation    Knee flexion    Knee extension    Ankle dorsiflexion    Ankle plantarflexion    Ankle inversion    Ankle eversion     (Blank rows = not tested)  LOWER EXTREMITY MMT:  MMT Right eval Left eval  Hip flexion    Hip extension    Hip abduction    Hip adduction    Hip internal rotation    Hip external rotation    Knee flexion    Knee extension    Ankle dorsiflexion    Ankle plantarflexion    Ankle inversion    Ankle eversion     (  Blank rows = not tested)  LOWER EXTREMITY SPECIAL TESTS:  {LEspecialtests:26242}  FUNCTIONAL TESTS:  {Functional tests:24029}  GAIT: Distance walked: *** Assistive device utilized: {Assistive devices:23999} Level of assistance: {Levels of assistance:24026} Comments: ***    TODAY'S TREATMENT: ***   PATIENT EDUCATION:  Education details: *** Person educated: {Person educated:25204} Education method: {Education Method:25205} Education comprehension: {Education Comprehension:25206}   HOME EXERCISE PROGRAM: ***  ASSESSMENT:  CLINICAL  IMPRESSION: Patient is a 61 y.o. female who was seen today for physical therapy evaluation and treatment for L ankle ORIF.    OBJECTIVE IMPAIRMENTS {opptimpairments:25111}.   ACTIVITY LIMITATIONS {activitylimitations:27494}  PARTICIPATION LIMITATIONS: {participationrestrictions:25113}  PERSONAL FACTORS H/O R breast cancer, R thumb surgery, L ankle lateral malleolus fracture are also affecting patient's functional outcome.   REHAB POTENTIAL: Good  CLINICAL DECISION MAKING: Stable/uncomplicated  EVALUATION COMPLEXITY: Low   GOALS: Goals reviewed with patient? Yes  SHORT TERM GOALS: Target date: {follow up:25551}   *** Baseline: Goal status: {GOALSTATUS:25110}  2.  *** Baseline:  Goal status: {GOALSTATUS:25110}  3.  *** Baseline:  Goal status: {GOALSTATUS:25110}  4.  *** Baseline:  Goal status: {GOALSTATUS:25110}  5.  *** Baseline:  Goal status: {GOALSTATUS:25110}  6.  *** Baseline:  Goal status: {GOALSTATUS:25110}  LONG TERM GOALS: Target date: {follow up:25551}   *** Baseline:  Goal status: {GOALSTATUS:25110}  2.  *** Baseline:  Goal status: {GOALSTATUS:25110}  3.  *** Baseline:  Goal status: {GOALSTATUS:25110}  4.  *** Baseline:  Goal status: {GOALSTATUS:25110}  5.  *** Baseline:  Goal status: {GOALSTATUS:25110}  6.  *** Baseline:  Goal status: {GOALSTATUS:25110}   PLAN: PT FREQUENCY: {rehab frequency:25116}  PT DURATION: {rehab duration:25117}  PLANNED INTERVENTIONS: {rehab planned interventions:25118::"Therapeutic exercises","Therapeutic activity","Neuromuscular re-education","Balance training","Gait training","Patient/Family education","Joint mobilization"}  PLAN FOR NEXT SESSION: Farley Ly, PT, MPT 02/12/2022, 3:55 PM

## 2022-11-03 ENCOUNTER — Other Ambulatory Visit: Payer: Self-pay | Admitting: Family Medicine

## 2022-11-03 DIAGNOSIS — Z1231 Encounter for screening mammogram for malignant neoplasm of breast: Secondary | ICD-10-CM

## 2022-11-11 ENCOUNTER — Encounter: Payer: Self-pay | Admitting: Acute Care

## 2022-11-12 ENCOUNTER — Ambulatory Visit
Admission: RE | Admit: 2022-11-12 | Discharge: 2022-11-12 | Disposition: A | Discharge status: Home / Self Care | Payer: BC Managed Care – PPO | Source: Ambulatory Visit | Attending: Family Medicine | Admitting: Family Medicine

## 2022-11-12 ENCOUNTER — Telehealth: Payer: Self-pay | Admitting: Acute Care

## 2022-11-12 DIAGNOSIS — Z1231 Encounter for screening mammogram for malignant neoplasm of breast: Secondary | ICD-10-CM

## 2022-11-12 NOTE — Telephone Encounter (Signed)
Spoke with [facility] imaging and informed them that MM 3D Screen Breat Bilateral preformed today was not ordered by [place] and they should contact ordering office to give call report to. Nothing further needed at this time.

## 2022-11-12 NOTE — Telephone Encounter (Signed)
Call report. Pls call [facility] @ [phone]. TY

## 2022-11-13 ENCOUNTER — Other Ambulatory Visit: Payer: Self-pay | Admitting: Family Medicine

## 2022-11-13 DIAGNOSIS — Z122 Encounter for screening for malignant neoplasm of respiratory organs: Secondary | ICD-10-CM

## 2022-11-14 ENCOUNTER — Inpatient Hospital Stay: Admission: RE | Admit: 2022-11-14 | Payer: BC Managed Care – PPO | Source: Ambulatory Visit

## 2022-11-18 ENCOUNTER — Other Ambulatory Visit: Payer: BC Managed Care – PPO

## 2022-12-08 ENCOUNTER — Other Ambulatory Visit: Payer: Self-pay | Admitting: Acute Care

## 2022-12-08 DIAGNOSIS — Z122 Encounter for screening for malignant neoplasm of respiratory organs: Secondary | ICD-10-CM

## 2022-12-09 ENCOUNTER — Ambulatory Visit
Admission: RE | Admit: 2022-12-09 | Discharge: 2022-12-09 | Disposition: A | Discharge status: Home / Self Care | Payer: BC Managed Care – PPO | Source: Ambulatory Visit | Attending: Acute Care | Admitting: Acute Care

## 2022-12-09 ENCOUNTER — Inpatient Hospital Stay: Admission: RE | Admit: 2022-12-09 | Payer: BC Managed Care – PPO | Source: Ambulatory Visit

## 2022-12-09 DIAGNOSIS — Z122 Encounter for screening for malignant neoplasm of respiratory organs: Secondary | ICD-10-CM

## 2022-12-12 ENCOUNTER — Other Ambulatory Visit: Payer: Self-pay | Admitting: Acute Care

## 2022-12-12 DIAGNOSIS — Z122 Encounter for screening for malignant neoplasm of respiratory organs: Secondary | ICD-10-CM

## 2022-12-12 DIAGNOSIS — Z87891 Personal history of nicotine dependence: Secondary | ICD-10-CM

## 2023-01-05 ENCOUNTER — Encounter: Payer: Self-pay | Admitting: *Deleted

## 2023-02-20 ENCOUNTER — Other Ambulatory Visit: Payer: Self-pay | Admitting: Family Medicine

## 2023-02-20 DIAGNOSIS — H93A3 Pulsatile tinnitus, bilateral: Secondary | ICD-10-CM

## 2023-03-30 ENCOUNTER — Encounter: Payer: Self-pay | Admitting: Family Medicine

## 2023-03-31 ENCOUNTER — Ambulatory Visit
Admission: RE | Admit: 2023-03-31 | Discharge: 2023-03-31 | Disposition: A | Discharge status: Home / Self Care | Payer: BC Managed Care – PPO | Source: Ambulatory Visit | Attending: Family Medicine | Admitting: Family Medicine

## 2023-03-31 DIAGNOSIS — H93A3 Pulsatile tinnitus, bilateral: Secondary | ICD-10-CM

## 2023-04-09 ENCOUNTER — Ambulatory Visit
Admission: RE | Admit: 2023-04-09 | Discharge: 2023-04-09 | Disposition: A | Discharge status: Home / Self Care | Payer: BC Managed Care – PPO | Source: Ambulatory Visit | Attending: Family Medicine | Admitting: Family Medicine

## 2023-04-09 ENCOUNTER — Other Ambulatory Visit: Payer: Self-pay | Admitting: Family Medicine

## 2023-04-09 DIAGNOSIS — H93A3 Pulsatile tinnitus, bilateral: Secondary | ICD-10-CM

## 2023-04-09 DIAGNOSIS — R9389 Abnormal findings on diagnostic imaging of other specified body structures: Secondary | ICD-10-CM

## 2023-04-09 MED ORDER — IOPAMIDOL (ISOVUE-370) INJECTION 76%
75.0000 mL | Freq: Once | INTRAVENOUS | Status: AC | PRN
  Administered 2023-04-09: 75 mL via INTRAVENOUS

## 2023-04-15 ENCOUNTER — Other Ambulatory Visit (HOSPITAL_COMMUNITY): Payer: Self-pay | Admitting: Interventional Radiology

## 2023-04-15 DIAGNOSIS — I771 Stricture of artery: Secondary | ICD-10-CM

## 2023-04-15 DIAGNOSIS — I671 Cerebral aneurysm, nonruptured: Secondary | ICD-10-CM

## 2023-04-16 ENCOUNTER — Other Ambulatory Visit: Payer: Self-pay | Admitting: Radiology

## 2023-04-16 ENCOUNTER — Other Ambulatory Visit: Payer: Self-pay | Admitting: Student

## 2023-04-16 DIAGNOSIS — Z419 Encounter for procedure for purposes other than remedying health state, unspecified: Secondary | ICD-10-CM

## 2023-04-17 ENCOUNTER — Other Ambulatory Visit (HOSPITAL_COMMUNITY): Payer: Self-pay | Admitting: Interventional Radiology

## 2023-04-17 ENCOUNTER — Other Ambulatory Visit: Payer: Self-pay

## 2023-04-17 ENCOUNTER — Ambulatory Visit (HOSPITAL_COMMUNITY)
Admission: RE | Admit: 2023-04-17 | Discharge: 2023-04-17 | Disposition: A | Discharge status: Home / Self Care | Payer: BC Managed Care – PPO | Source: Ambulatory Visit | Attending: Interventional Radiology | Admitting: Interventional Radiology

## 2023-04-17 DIAGNOSIS — I671 Cerebral aneurysm, nonruptured: Secondary | ICD-10-CM

## 2023-04-17 DIAGNOSIS — I771 Stricture of artery: Secondary | ICD-10-CM | POA: Diagnosis not present

## 2023-04-17 DIAGNOSIS — Z419 Encounter for procedure for purposes other than remedying health state, unspecified: Secondary | ICD-10-CM

## 2023-04-17 DIAGNOSIS — H93A3 Pulsatile tinnitus, bilateral: Secondary | ICD-10-CM | POA: Diagnosis present

## 2023-04-17 DIAGNOSIS — Z87891 Personal history of nicotine dependence: Secondary | ICD-10-CM | POA: Insufficient documentation

## 2023-04-17 HISTORY — PX: IR ANGIO INTRA EXTRACRAN SEL COM CAROTID INNOMINATE BILAT MOD SED: IMG5360

## 2023-04-17 HISTORY — PX: IR ANGIO VERTEBRAL SEL SUBCLAVIAN INNOMINATE UNI L MOD SED: IMG5364

## 2023-04-17 HISTORY — PX: IR ANGIO VERTEBRAL SEL VERTEBRAL UNI R MOD SED: IMG5368

## 2023-04-17 LAB — BASIC METABOLIC PANEL
Anion gap: 10 (ref 5–15)
BUN: 13 mg/dL (ref 8–23)
CO2: 25 mmol/L (ref 22–32)
Calcium: 8.9 mg/dL (ref 8.9–10.3)
Chloride: 104 mmol/L (ref 98–111)
Creatinine, Ser: 0.67 mg/dL (ref 0.44–1.00)
GFR, Estimated: >60 mL/min (ref >60)
Glucose, Bld: 116 mg/dL — ABNORMAL HIGH (ref 70–99)
Potassium: 4.3 mmol/L (ref 3.5–5.1)
Sodium: 139 mmol/L (ref 135–145)

## 2023-04-17 LAB — CBC
HCT: 43.1 % (ref 36.0–46.0)
Hemoglobin: 14.3 g/dL (ref 12.0–15.0)
MCH: 31 pg (ref 26.0–34.0)
MCHC: 33.2 g/dL (ref 30.0–36.0)
MCV: 93.5 fL (ref 80.0–100.0)
Platelets: 234 10*3/uL (ref 150–400)
RBC: 4.61 MIL/uL (ref 3.87–5.11)
RDW: 12.4 % (ref 11.5–15.5)
WBC: 4.6 10*3/uL (ref 4.0–10.5)
nRBC: 0 % (ref 0.0–0.2)

## 2023-04-17 LAB — PROTIME-INR
INR: 0.9 (ref 0.8–1.2)
Prothrombin Time: 12.5 seconds (ref 11.4–15.2)

## 2023-04-17 MED ORDER — FENTANYL CITRATE (PF) 100 MCG/2ML IJ SOLN
INTRAMUSCULAR | Status: AC
  Filled 2023-04-17: qty 2

## 2023-04-17 MED ORDER — VERAPAMIL HCL 2.5 MG/ML IV SOLN
INTRAVENOUS | Status: AC
  Filled 2023-04-17: qty 2

## 2023-04-17 MED ORDER — MIDAZOLAM HCL 2 MG/2ML IJ SOLN
INTRAMUSCULAR | Status: AC
  Filled 2023-04-17: qty 2

## 2023-04-17 MED ORDER — LIDOCAINE HCL 1 % IJ SOLN
INTRAMUSCULAR | Status: AC
  Filled 2023-04-17: qty 20

## 2023-04-17 MED ORDER — IOHEXOL 300 MG/ML  SOLN
150.0000 mL | Freq: Once | INTRAMUSCULAR | Status: AC | PRN
  Administered 2023-04-17: 125 mL via INTRA_ARTERIAL

## 2023-04-17 MED ORDER — NITROGLYCERIN 1 MG/10 ML FOR IR/CATH LAB
INTRA_ARTERIAL | Status: AC
  Filled 2023-04-17: qty 10

## 2023-04-17 MED ORDER — SODIUM CHLORIDE 0.9 % IV SOLN: INTRAVENOUS | Status: DC

## 2023-04-17 MED ORDER — FENTANYL CITRATE (PF) 100 MCG/2ML IJ SOLN
INTRAMUSCULAR | Status: AC | PRN
  Administered 2023-04-17 (×2): 25 ug via INTRAVENOUS

## 2023-04-17 MED ORDER — HEPARIN SODIUM (PORCINE) 1000 UNIT/ML IJ SOLN
INTRAMUSCULAR | Status: AC
  Filled 2023-04-17: qty 10

## 2023-04-17 MED ORDER — LIDOCAINE HCL 1 % IJ SOLN
20.0000 mL | Freq: Once | INTRAMUSCULAR | Status: AC
  Administered 2023-04-17: 10 mL via INTRADERMAL

## 2023-04-17 MED ORDER — MIDAZOLAM HCL 2 MG/2ML IJ SOLN
INTRAMUSCULAR | Status: AC | PRN
  Administered 2023-04-17: .5 mg via INTRAVENOUS
  Administered 2023-04-17: 1 mg via INTRAVENOUS

## 2023-04-17 MED ORDER — ACETAMINOPHEN 325 MG PO TABS
650.0000 mg | ORAL_TABLET | Freq: Once | ORAL | Status: AC
  Administered 2023-04-17: 650 mg via ORAL
  Filled 2023-04-17: qty 2

## 2023-04-17 MED ORDER — SODIUM CHLORIDE 0.9 % IV SOLN: INTRAVENOUS | Status: AC

## 2023-04-17 MED ORDER — HEPARIN SODIUM (PORCINE) 1000 UNIT/ML IJ SOLN
INTRAMUSCULAR | Status: AC | PRN
  Administered 2023-04-17: 1000 [IU] via INTRAVENOUS

## 2023-04-17 NOTE — H&P (Signed)
Chief Complaint: Patient was seen in consultation today for cerebral angiogram   Supervising Physician: Julieanne Cotton  Patient Status: [facility] - Out-pt  History of Present Illness: Whitney [name] is a 62 y.o. female with sxs of pulsatile tinnitus. She underwent MRI/MRA and subsequent CTA of the head and neck, finding intracranial carotid stenosis as well as a small left paraclinoid aneurysm. She has been referred to Dr. Corliss Skains for formal cerebral angiogram.  PMHx, meds, labs, imaging, allergies reviewed. Feels well, no recent fevers, chills, illness. Has been NPO today as directed.   Past Medical History:  Diagnosis Date   Cancer (HCC)    breast-rt   Depression    Wears glasses     Past Surgical History:  Procedure Laterality Date   BILATERAL SALPINGOOPHORECTOMY  2010   with hyst   BREAST LUMPECTOMY WITH AXILLARY LYMPH NODE BIOPSY  2001   right   BREAST RECONSTRUCTION  2001   right trans flap with mastectomy   BREAST RECONSTRUCTION  2002   revision rt flap   CYST EXCISION  2004   rt axilla   DILATION AND CURETTAGE OF UTERUS     HAND EXPLORATION     age 62-injury-repair trama-rt   LAPAROSCOPIC VAGINAL HYSTERECTOMY  2010   LIGAMENT REPAIR Right 11/02/2013   Procedure: REPAIR RADIAL COLLATERAL LIGAMENT METACARPAL PHALANGEAL RIGHT THUMB;  Surgeon: Nicki Reaper, MD;  Location: [facility];  Service: Orthopedics;  Laterality: Right;   MASTECTOMY  2001   right-followed by reconstruction   RECONSTRUCTION / CORRECTION OF NIPPLE / AEROLA  2003    Allergies: Patient has no known allergies.  Medications: Prior to Admission medications   Medication Sig Start Date End Date Taking? Authorizing Provider  FLUoxetine (PROZAC) 40 MG capsule Take 40 mg by mouth daily.    [provider]  HYDROcodone-acetaminophen (NORCO) 5-325 MG per tablet Take 1 tablet by mouth every 6 (six) hours as needed for moderate pain. 11/02/13   Cindee Salt, MD  Multiple  Vitamins-Minerals (MULTIVITAMIN WITH MINERALS) tablet Take 1 tablet by mouth daily.    [provider]     No family history on file.  Social History   Socioeconomic History   Marital status: Married    Spouse name: Not on file   Number of children: Not on file   Years of education: Not on file   Highest education level: Not on file  Occupational History   Not on file  Tobacco Use   Smoking status: Former    Current packs/day: 0.50    Types: Cigarettes   Smokeless tobacco: Former    Quit date: 07/20/2014   Tobacco comments:    Encouraged to remain smoke free  Substance and Sexual Activity   Alcohol use: Yes    Alcohol/week: 0.0 standard drinks of alcohol   Drug use: No   Sexual activity: Not on file  Other Topics Concern   Not on file  Social History Narrative   Not on file   Social Determinants of Health   Financial Resource Strain: Not on file  Food Insecurity: Not on file  Transportation Needs: Not on file  Physical Activity: Not on file  Stress: Not on file  Social Connections: Not on file    Review of Systems: A 12 point ROS discussed and pertinent positives are indicated in the HPI above.  All other systems are negative.  Review of Systems  Vital Signs: BP (!) 154/84   Pulse 64  Temp 98.2 F (36.8 C) (Temporal)   Resp 16   Ht 5' 1.5" (1.562 m)   Wt 190 lb (86.2 kg)   SpO2 95%   BMI 35.32 kg/m   Physical Exam Constitutional:      Appearance: She is not ill-appearing.  HENT:     Mouth/Throat:     Mouth: Mucous membranes are moist.     Pharynx: Oropharynx is clear.  Cardiovascular:     Rate and Rhythm: Normal rate and regular rhythm.     Pulses: Normal pulses.     Heart sounds: Normal heart sounds.  Pulmonary:     Effort: Pulmonary effort is normal. No respiratory distress.     Breath sounds: Normal breath sounds.  Abdominal:     Palpations: Abdomen is soft.  Skin:    General: Skin is warm and dry.  Neurological:     General:  No focal deficit present.     Mental Status: She is alert and oriented to person, place, and time.  Psychiatric:        Mood and Affect: Mood normal.        Thought Content: Thought content normal.     Imaging: CT ANGIO HEAD W OR WO CONTRAST  Result Date: 04/09/2023 CLINICAL DATA:  Abnormal MRI.  Pulsatile tinnitus, bilateral. EXAM: CT ANGIOGRAPHY HEAD TECHNIQUE: Multidetector CT imaging of the head was performed using the standard protocol during bolus administration of intravenous contrast. Multiplanar CT image reconstructions and MIPs were obtained to evaluate the vascular anatomy. RADIATION DOSE REDUCTION: This exam was performed according to the departmental dose-optimization program which includes automated exposure control, adjustment of the mA and/or kV according to patient size and/or use of iterative reconstruction technique. CONTRAST:  75mL ISOVUE-370 IOPAMIDOL (ISOVUE-370) INJECTION 76% COMPARISON:  MRA head and neck 03/31/2023. FINDINGS: CT HEAD Brain: Cerebral volume is normal. There is no acute intracranial hemorrhage. No demarcated cortical infarct. No extra-axial fluid collection. No evidence of an intracranial mass. No midline shift. Vascular: No hyperdense vessel.  Atherosclerotic calcifications. Skull: No calvarial fracture or aggressive osseous lesion. Sinuses: No mass or acute finding within the imaged orbits. Mild-to-moderate mucosal thickening within the right maxillary sinus. CTA HEAD Anterior circulation: The intracranial internal carotid arteries are patent. Atherosclerotic plaque within the cavernous and paraclinoid segments, bilaterally. Most notably, there are sites of up to moderate stenosis within the paraclinoid segment on the right and within the cavernous segment on the left. The intracranial internal carotid arteries are patent. The M1 middle cerebral arteries are patent. No M2 proximal branch occlusion or high-grade proximal stenosis. The anterior cerebral arteries are  patent. 2 mm inferiorly projecting vascular protrusion arising from the paraclinoid left ICA (near the posterior communicating artery origin), likely reflecting an aneurysm (series 19, image 97) (series 16, image 119). Posterior circulation: The intracranial vertebral arteries are patent. The basilar artery is patent. The posterior cerebral arteries are patent. Posterior communicating arteries are present bilaterally. Venous sinuses: There is limited assessment of the dural venous sinuses due to contrast timing. Anatomic variants: None significant. Review of the MIP images confirms the above findings. IMPRESSION: Non-contrast head CT: 1. Unremarkable non-contrast CT appearance of the brain. No evidence of an acute intracranial abnormality. 2. Mild-to-moderate mucosal thickening within the right maxillary sinus. CTA head: 1. No intracranial large vessel occlusion. 2. Atherosclerotic plaque within the intracranial internal carotid arteries, bilaterally. Most notably, there are sites of moderate stenosis within the paraclinoid segment on the right, and within the cavernous segment on the  left. 3. 2 mm aneurysm arising from the paraclinoid left internal carotid artery. Electronically Signed   By: Jackey Loge D.O.   On: 04/09/2023 12:21   MR ANGIO NECK WO CONTRAST  Result Date: 04/08/2023 CLINICAL DATA:  Bilateral pulsatile tinnitus noted over the last 4 months. EXAM: MRA NECK WITHOUT CONTRAST TECHNIQUE: Angiographic images of the neck were acquired using MRA technique without intravenous contrast. Carotid stenosis measurements (when applicable) are obtained utilizing NASCET criteria, using the distal internal carotid diameter as the denominator. COMPARISON:  None Available. FINDINGS: Aortic arch: Not evaluated using noncontrast technique. Right carotid system: Common carotid artery widely patent to the bifurcation. Normal carotid bifurcation without stenosis or irregularity. Cervical ICA appears normal. Left  carotid system: Common carotid artery widely patent to the bifurcation. Mild stenosis of the ICA bulb, estimated at 40-50%. This could possibly have an artifactual component related to some patient motion. Beyond that, the cervical ICA is widely patent to the skull base. Vertebral arteries: Both vertebral arteries appear normal through the cervical region with antegrade flow. Proximal vertebral arteries are not evaluated using noncontrast technique. Other: None. IMPRESSION: 1. No significant finding in the right carotid system. 2. 40-50% stenosis of the left ICA bulb. This could possibly have an artifactual component related to some patient motion. 3. Normal appearance of the vertebral arteries in the cervical region. Proximal vertebral arteries are not evaluated using noncontrast technique. Electronically Signed   By: Paulina Fusi M.D.   On: 04/08/2023 08:31   MR ANGIO HEAD WO CONTRAST  Result Date: 04/08/2023 CLINICAL DATA:  Bilateral pulsatile tinnitus noted over the last 4 months EXAM: MRA HEAD WITHOUT CONTRAST TECHNIQUE: Angiographic images of the Circle of Willis were acquired using MRA technique without intravenous contrast. COMPARISON:  No prior CNS imaging. FINDINGS: Anterior circulation: Both internal carotid arteries are patent through the skull base and siphon regions. No siphon stenosis. There is signal loss in both supraclinoid internal carotid artery regions which would appear to indicate severe stenosis. If not artifactual, this represents significant disease. Therefore, CT angiography would be recommended for confirmation and further characterization. Beyond that, the anterior and middle cerebral vessels are patent. Posterior circulation: Both vertebral arteries are patent through the foramen magnum. Right PICA is dominant. No basilar artery stenosis. Superior cerebellar and posterior cerebral arteries are patent. Large posterior communicating artery on the left. Anatomic variants: None other  significant. Other: None. IMPRESSION: Signal loss in both supraclinoid internal carotid arteries which would appear to indicate severe bilateral stenosis. If not artifactual, this represents significant disease. Therefore, CT angiography would be recommended for confirmation and further characterization. Electronically Signed   By: Paulina Fusi M.D.   On: 04/08/2023 08:28    Labs:  CBC: Recent Labs    04/17/23 0712  WBC 4.6  HGB 14.3  HCT 43.1  PLT 234    COAGS: Recent Labs    04/17/23 0712  INR 0.9    BMP: No results for input(s): "NA", "K", "CL", "CO2", "GLUCOSE", "BUN", "CALCIUM", "CREATININE", "GFRNONAA", "GFRAA" in the last 8760 hours.  Invalid input(s): "CMP"  LIVER FUNCTION TESTS: No results for input(s): "BILITOT", "AST", "ALT", "ALKPHOS", "PROT", "ALBUMIN" in the last 8760 hours.   Assessment and Plan: Pulsatile tinnitus Intracranial arterial stenosis and aneurysm Risks and benefits of cerebral angiogram were discussed with the patient including, but not limited to bleeding, infection, vascular injury or contrast induced renal failure.  This interventional procedure involves the use of X-rays and because of the nature of the  planned procedure, it is possible that we will have prolonged use of X-ray fluoroscopy.  Potential radiation risks to you include (but are not limited to) the following: - A slightly elevated risk for cancer  several years later in life. This risk is typically less than 0.5% percent. This risk is low in comparison to the normal incidence of human cancer, which is 33% for women and 50% for men according to the [employer] Society. - Radiation induced injury can include skin redness, resembling a rash, tissue breakdown / ulcers and hair loss (which can be temporary or permanent).   The likelihood of either of these occurring depends on the difficulty of the procedure and whether you are sensitive to radiation due to previous procedures,  disease, or genetic conditions.   IF your procedure requires a prolonged use of radiation, you will be notified and given written instructions for further action.  It is your responsibility to monitor the irradiated area for the 2 weeks following the procedure and to notify your physician if you are concerned that you have suffered a radiation induced injury.    All of the patient's questions were answered, patient is agreeable to proceed.  Consent signed and in chart.    Electronically Signed: Brayton El, PA-C 04/17/2023, 8:13 AM   I spent a total of 20 minutes in face to face in clinical consultation, greater than 50% of which was counseling/coordinating care for cerebral angiogram

## 2023-04-17 NOTE — Progress Notes (Signed)
HOB raised to 30 degrees with no signs of oozing from right groin site

## 2023-04-17 NOTE — Procedures (Signed)
INR.  Status post four-vessel cerebral arteriogram.  Right CFA approach.  Findings.  1.  Severe bilateral ICA terminal stenosis secondary to the atherosclerotic disease.  2.  Approximately 3 mm x 2.7 mm wide neck right P-comm region aneurysm, with a smaller proximal right paraclinoid aneurysm.  3.  Approximately 2 .2 mm left P-comm region aneurysm.  4.  Approximately 9 mm x 7 mm right high riding internal jugular bulb .   Fatima Sanger MD.

## 2023-04-21 ENCOUNTER — Encounter (HOSPITAL_COMMUNITY): Payer: Self-pay

## 2023-04-21 ENCOUNTER — Ambulatory Visit (HOSPITAL_COMMUNITY)
Admission: RE | Admit: 2023-04-21 | Discharge: 2023-04-21 | Disposition: A | Discharge status: Home / Self Care | Payer: BC Managed Care – PPO | Source: Ambulatory Visit | Attending: Interventional Radiology | Admitting: Interventional Radiology

## 2023-04-21 DIAGNOSIS — I671 Cerebral aneurysm, nonruptured: Secondary | ICD-10-CM

## 2023-04-21 DIAGNOSIS — I771 Stricture of artery: Secondary | ICD-10-CM

## 2023-04-22 HISTORY — PX: IR RADIOLOGIST EVAL & MGMT: IMG5224

## 2023-05-07 ENCOUNTER — Other Ambulatory Visit (HOSPITAL_COMMUNITY): Payer: Self-pay

## 2023-05-07 DIAGNOSIS — I729 Aneurysm of unspecified site: Secondary | ICD-10-CM

## 2023-05-08 LAB — PLATELET INHIBITION P2Y12

## 2023-05-12 ENCOUNTER — Other Ambulatory Visit: Payer: Self-pay | Admitting: Student

## 2023-05-12 ENCOUNTER — Telehealth: Payer: Self-pay | Admitting: Student

## 2023-05-12 MED ORDER — TICAGRELOR 90 MG PO TABS: 90.0000 mg | ORAL_TABLET | Freq: Two times a day (BID) | ORAL | 0 refills | Status: DC

## 2023-05-12 NOTE — Telephone Encounter (Signed)
Patient is s/p diagnostic cerebral angiogram by Dr. Corliss Skains on 04/20/23 which showed bilateral severe distal internal carotid artery stenosis and three brain aneurysms.   Patient had follow up visit on 04/22/23, for the bilateral severe ICA stenosis, patient was started on  Plavix 75 mg and ASA 81 once a day and P2Y12 was checked 10-15 days after taking Plavix and ASA.   P2Y12 result came back, 169.  Discussed with Dr. Corliss Skains, recommend switching Plavix 75 every day to Brilinta 90 mg BID. Patient was called and informed the recommendation, she was informed that Brilinta can be expensive and one of the common side effect it difficulty breathing which typically goes away in 2 weeks but there are small number of patients who does not tolerate Brilinta well.   Mr. Westra stated that she will check with her pharmacy to see how much Brilinta would be and if she cannot afford the medicine, she will notify IR.  Call back number provided.   Please call IR for questions and concerns.   Lynann Bologna Gracelynn Bircher PA-C 05/12/2023 2:02 PM

## 2023-07-07 ENCOUNTER — Telehealth: Payer: Self-pay | Admitting: Neurology

## 2023-07-07 NOTE — Telephone Encounter (Signed)
Received referral from Dr. Julieanne Cotton for continued medical management of aneurysms and stenosis. Dr. Lucia Gaskins spoke with Whitney [name] and discussed. Per Dr. Lucia Gaskins referral here declined, pt will f/u with Dr. Nicki Reaper and vascular.

## 2023-07-08 ENCOUNTER — Other Ambulatory Visit: Payer: Self-pay | Admitting: Student

## 2023-07-08 DIAGNOSIS — I671 Cerebral aneurysm, nonruptured: Secondary | ICD-10-CM

## 2023-07-08 MED ORDER — TICAGRELOR 60 MG PO TABS
60.0000 mg | ORAL_TABLET | Freq: Two times a day (BID) | ORAL | 0 refills | Status: DC
Start: 2023-07-08 — End: 2023-10-05

## 2023-07-08 NOTE — Progress Notes (Signed)
Patient called to report increased fatigue since taking Brilinta.  She requests an alternative.  She was previously on Plavix, however her P2Y12 remained >150 and therefore Brilinta was recommended.  Discussed with Dr. Corliss Skains who recommends decreasing dosage to 60mg  BID and test P2Y12 in 1-2 weeks.   Script called into the [facility], her preferred pharmacy.   Patient agreeable to plan.   Loyce Dys, MS RD PA-C 4:49 PM

## 2023-07-09 ENCOUNTER — Other Ambulatory Visit: Payer: Self-pay | Admitting: Radiology

## 2023-07-28 ENCOUNTER — Encounter: Payer: Self-pay | Admitting: Cardiology

## 2023-07-28 DIAGNOSIS — I6523 Occlusion and stenosis of bilateral carotid arteries: Secondary | ICD-10-CM | POA: Insufficient documentation

## 2023-07-28 DIAGNOSIS — E785 Hyperlipidemia, unspecified: Secondary | ICD-10-CM | POA: Insufficient documentation

## 2023-07-28 DIAGNOSIS — I10 Essential (primary) hypertension: Secondary | ICD-10-CM | POA: Insufficient documentation

## 2023-07-28 NOTE — Progress Notes (Unsigned)
Cardiology Office Note   Date:  07/30/2023   ID:  Whitney [name], DOB [birth year], MRN [id number]  PCP:  Laurann Montana, MD  Cardiologist:   None Referring:  Laurann Montana, MD  No chief complaint on file.     History of Present Illness: Whitney [name] is a 62 y.o. female who was referred by Laurann Montana, MD for evaluation of bilateral carotid artery stenosis.  She had no prior cardiac history and so she complained of some pulsatile tinnitus and started getting evaluation.  She has now had CT angiography and MRA.  She has carotid stenosis in the internal carotid arteries.  Appears to be 40 to 50% on the left.  Other findings suggest bilateral severe distal internal carotid stenosis involving supraclinoid segments.  Small aneurysm arising from the right posterior communicating artery, small saccular aneurysm from the posterior wall of the left internal carotid artery and one mentioned in the right jugular bulb.  She did have a chest CT that did demonstrate some aortic atherosclerosis emphysema and steatosis but there was no mention of coronary calcium.  This was done in the lung screening and there was no lung nodule noted.  She is not prior to this had any cardiac history.  She does not have any cardiovascular symptoms.  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  She does yoga.  The patient denies any new symptoms such as chest discomfort, neck or arm discomfort. There has been no PND or orthopnea. There have been no reported palpitations, presyncope or syncope.   She does have some decreased exercise tolerance and also some mild SOB.    Past Medical History:  Diagnosis Date   Aortic atherosclerosis (HCC)    Cancer (HCC)    breast-rt   Depression    Dyslipidemia    Hypertension     Past Surgical History:  Procedure Laterality Date   BILATERAL SALPINGOOPHORECTOMY  2010   with hyst   BREAST LUMPECTOMY WITH AXILLARY LYMPH NODE BIOPSY  2001   right   BREAST  RECONSTRUCTION  2001   right trans flap with mastectomy   BREAST RECONSTRUCTION  2002   revision rt flap   CYST EXCISION  2004   rt axilla   DILATION AND CURETTAGE OF UTERUS     HAND EXPLORATION     age 32-injury-repair trama-rt   IR ANGIO INTRA EXTRACRAN SEL COM CAROTID INNOMINATE BILAT MOD SED  04/17/2023   IR ANGIO VERTEBRAL SEL SUBCLAVIAN INNOMINATE UNI L MOD SED  04/17/2023   IR ANGIO VERTEBRAL SEL VERTEBRAL UNI R MOD SED  04/17/2023   IR RADIOLOGIST EVAL & MGMT  04/22/2023   LAPAROSCOPIC VAGINAL HYSTERECTOMY  2010   LIGAMENT REPAIR Right 11/02/2013   Procedure: REPAIR RADIAL COLLATERAL LIGAMENT METACARPAL PHALANGEAL RIGHT THUMB;  Surgeon: Nicki Reaper, MD;  Location: [facility];  Service: Orthopedics;  Laterality: Right;   MASTECTOMY  2001   right-followed by reconstruction   RECONSTRUCTION / CORRECTION OF NIPPLE / AEROLA  2003     Current Outpatient Medications  Medication Sig Dispense Refill   aspirin EC 81 MG tablet Take 81 mg by mouth daily. Swallow whole.     Calcium Citrate-Vitamin D 315-5 MG-MCG TABS Take by mouth.     CRESTOR 20 MG tablet Take 20 mg by mouth daily.     cyanocobalamin 50 MCG tablet Take 1 tablet by mouth daily.     desvenlafaxine (PRISTIQ) 100 MG 24 hr tablet  Take 1 tablet by mouth daily.     ticagrelor (BRILINTA) 60 MG TABS tablet Take 1 tablet (60 mg total) by mouth 2 (two) times daily. 180 tablet 0   valsartan (DIOVAN) 320 MG tablet Take 320 mg by mouth daily.     VRAYLAR 1.5 MG capsule Take 1.5 mg by mouth daily.     FLUoxetine (PROZAC) 40 MG capsule Take 40 mg by mouth daily. (Patient not taking: Reported on 07/30/2023)     HYDROcodone-acetaminophen (NORCO) 5-325 MG per tablet Take 1 tablet by mouth every 6 (six) hours as needed for moderate pain. (Patient not taking: Reported on 07/30/2023) 30 tablet 0   Multiple Vitamins-Minerals (MULTIVITAMIN WITH MINERALS) tablet Take 1 tablet by mouth daily. (Patient not taking: Reported on 07/30/2023)      No current facility-administered medications for this visit.    Allergies:   Patient has no known allergies.    Social History:  The patient  reports that she has quit smoking. Her smoking use included cigarettes. She quit smokeless tobacco use about 9 years ago. She reports current alcohol use. She reports that she does not use drugs.   Family History:  The patient's family history includes Atrial fibrillation in her mother; Prostate cancer in her father.    ROS:  Please see the history of present illness.   Otherwise, review of systems are positive for none.   All other systems are reviewed and negative.    PHYSICAL EXAM: VS:  BP 118/76 (BP Location: Left Arm, Patient Position: Sitting, Cuff Size: Normal)   Pulse 77   Ht 5\' 2"  (1.575 m)   Wt 195 lb 12.8 oz (88.8 kg)   SpO2 97%   BMI 35.81 kg/m  , BMI Body mass index is 35.81 kg/m. GENERAL:  Well appearing HEENT:  Pupils equal round and reactive, fundi not visualized, oral mucosa unremarkable NECK:  No jugular venous distention, waveform within normal limits, carotid upstroke brisk and symmetric, no bruits, no thyromegaly LYMPHATICS:  No cervical, inguinal adenopathy LUNGS:  Clear to auscultation bilaterally BACK:  No CVA tenderness CHEST:  Unremarkable HEART:  PMI not displaced or sustained,S1 and S2 within normal limits, no S3, no S4, no clicks, no rubs, no murmurs ABD:  Flat, positive bowel sounds normal in frequency in pitch, no bruits, no rebound, no guarding, no midline pulsatile mass, no hepatomegaly, no splenomegaly EXT:  2 plus pulses throughout, no edema, no cyanosis no clubbing SKIN:  No rashes no nodules NEURO:  Cranial nerves II through XII grossly intact, motor grossly intact throughout [facility]:  Cognitively intact, oriented to person place and time    EKG:  EKG Interpretation Date/Time:  Thursday July 30 2023 08:14:01 EST Ventricular Rate:  67 PR Interval:  136 QRS Duration:  90 QT Interval:  400 QTC  Calculation: 422 R Axis:   61  Text Interpretation: Normal sinus rhythm Normal ECG When compared with ECG of 30-Jul-2000 14:43, No significant change was found Confirmed by Rollene Rotunda ([id number]) on 07/30/2023 8:16:34 AM     Recent Labs: 04/17/2023: BUN 13; Creatinine, Ser 0.67; Hemoglobin 14.3; Platelets 234; Potassium 4.3; Sodium 139    Lipid Panel No results found for: "CHOL", "TRIG", "HDL", "CHOLHDL", "VLDL", "LDLCALC", "LDLDIRECT"    Wt Readings from Last 3 Encounters:  07/30/23 195 lb 12.8 oz (88.8 kg)  04/17/23 190 lb (86.2 kg)  10/05/19 170 lb (77.1 kg)      Other studies Reviewed: Additional studies/ records that were reviewed today include: Labs,  radiology studies. Review of the above records demonstrates:  Please see elsewhere in the note.     ASSESSMENT AND PLAN:  Carotid stenosis: She has nonobstructive carotid disease and should get carotid Doppler follow-up in 1 year.  She otherwise needs aggressive risk reduction.  HTN: Her blood pressure is currently controlled on the meds as listed.  No change in therapy.  Dyslipidemia: We do very long discussion about her medical therapy as well as diet and exercise.  I will order an LP(a).  Shortness of breath: The patient has some mild decreased exercise tolerance and may be mild shortness of breath.  I did not see any coronary calcium on her chest CT that was done recently.  However, she has some aortic atherosclerosis. I will bring the patient back for a POET (Plain Old Exercise Test). This will allow me to screen for obstructive coronary disease, risk stratify and very importantly provide a prescription for exercise.  Current medicines are reviewed at length with the patient today.  The patient does not have concerns regarding medicines.  The following changes have been made:  no change  Labs/ tests ordered today include:   Orders Placed This Encounter  Procedures   Lipoprotein A (LPA)   EXERCISE TOLERANCE TEST  (ETT)   EKG 12-Lead   VAS US CAROTID     Disposition:   FU with me in as needed.      Signed, Rollene Rotunda, MD  07/30/2023 12:16 PM    [facility]

## 2023-07-30 ENCOUNTER — Encounter: Payer: Self-pay | Admitting: Cardiology

## 2023-07-30 ENCOUNTER — Ambulatory Visit: Payer: BC Managed Care – PPO | Attending: Cardiology | Admitting: Cardiology

## 2023-07-30 VITALS — BP 118/76 | HR 77 | Ht 62.0 in | Wt 195.8 lb

## 2023-07-30 DIAGNOSIS — I10 Essential (primary) hypertension: Secondary | ICD-10-CM

## 2023-07-30 DIAGNOSIS — E785 Hyperlipidemia, unspecified: Secondary | ICD-10-CM

## 2023-07-30 DIAGNOSIS — R0602 Shortness of breath: Secondary | ICD-10-CM

## 2023-07-30 DIAGNOSIS — I6523 Occlusion and stenosis of bilateral carotid arteries: Secondary | ICD-10-CM

## 2023-07-30 NOTE — Patient Instructions (Signed)
Medication Instructions:  No changes *If you need a refill on your cardiac medications before your next appointment, please call your pharmacy*   Lab Work: LPa If you have labs (blood work) drawn today and your tests are completely normal, you will receive your results only by: MyChart Message (if you have MyChart) OR A paper copy in the mail If you have any lab test that is abnormal or we need to change your treatment, we will call you to review the results.   Testing/Procedures: Your physician has requested that you have a carotid duplex IN ONE YEAR. This test is an ultrasound of the carotid arteries in your neck. It looks at blood flow through these arteries that supply the brain with blood. Allow one hour for this exam. There are no restrictions or special instructions.              Patient Instructions for Stress Test (ETT- Exercise Treadmill Test)  Medication instructions:  N/A________________________________  Do not eat, drink or use tobacco products four hours prior to the test.  Water is ok.  3.  Dress prepared to exercise in a comfortable, two piece clothing outfit and walking shoes.  4.  Bring any current prescription medications with you the day of the test.  5.  Notify the office 24 hours in advance if you cannot keep this appointment.  6.  If you have any questions, please call [phone].    Follow-Up: At [facility], you and your health needs are our priority.  As part of our continuing mission to provide you with exceptional heart care, we have created designated Provider Care Teams.  These Care Teams include your primary Cardiologist (physician) and Advanced Practice Providers (APPs -  Physician Assistants and Nurse Practitioners) who all work together to provide you with the care you need, when you need it.  We recommend signing up for the patient portal called "MyChart".  Sign up information is provided on this After Visit Summary.  MyChart is used  to connect with patients for Virtual Visits (Telemedicine).  Patients are able to view lab/test results, encounter notes, upcoming appointments, etc.  Non-urgent messages can be sent to your provider as well.   To learn more about what you can do with MyChart, go to [email or web address].    Your next appointment:    Follow up as needed  Provider:   Dr Antoine Poche

## 2023-07-31 LAB — LIPOPROTEIN A (LPA): Lipoprotein (a): 158.6 nmol/L — ABNORMAL HIGH (ref <75.0)

## 2023-08-18 ENCOUNTER — Ambulatory Visit: Payer: BC Managed Care – PPO | Attending: Internal Medicine

## 2023-08-18 DIAGNOSIS — R0602 Shortness of breath: Secondary | ICD-10-CM

## 2023-08-18 LAB — EXERCISE TOLERANCE TEST
Angina Index: 0
Duke Treadmill Score: 7
Estimated workload: 7.8
Exercise duration (min): 6 min
Exercise duration (sec): 31 s
MPHR: 156 {beats}/min
Peak HR: 150 {beats}/min
Percent HR: 96 %
RPE: 17
Rest HR: 69 {beats}/min
ST Depression (mm): 0 mm

## 2023-10-05 ENCOUNTER — Other Ambulatory Visit: Payer: Self-pay | Admitting: Student

## 2023-10-05 DIAGNOSIS — I671 Cerebral aneurysm, nonruptured: Secondary | ICD-10-CM

## 2023-10-05 MED ORDER — TICAGRELOR 60 MG PO TABS: 60.0000 mg | ORAL_TABLET | Freq: Two times a day (BID) | ORAL | 0 refills | Status: AC

## 2023-10-05 NOTE — Telephone Encounter (Signed)
 Refill of Brilinta 60mg  BID called into preferred pharmacy.  Patient due to repeat imaging soon.  Will re-evaluate need for Brilinta based on scans with Dr. Corliss Skains.   Loyce Dys, MS RD PA-C

## 2023-10-30 ENCOUNTER — Other Ambulatory Visit: Payer: Self-pay

## 2023-10-30 DIAGNOSIS — Z87891 Personal history of nicotine dependence: Secondary | ICD-10-CM

## 2023-10-30 DIAGNOSIS — Z122 Encounter for screening for malignant neoplasm of respiratory organs: Secondary | ICD-10-CM

## 2023-11-11 ENCOUNTER — Other Ambulatory Visit (HOSPITAL_COMMUNITY): Payer: Self-pay | Admitting: Interventional Radiology

## 2023-11-11 DIAGNOSIS — I671 Cerebral aneurysm, nonruptured: Secondary | ICD-10-CM

## 2023-12-04 ENCOUNTER — Ambulatory Visit (HOSPITAL_COMMUNITY)
Admission: RE | Admit: 2023-12-04 | Discharge: 2023-12-04 | Disposition: A | Discharge status: Home / Self Care | Payer: Self-pay | Source: Ambulatory Visit | Attending: Interventional Radiology | Admitting: Interventional Radiology

## 2023-12-04 DIAGNOSIS — I671 Cerebral aneurysm, nonruptured: Secondary | ICD-10-CM | POA: Insufficient documentation

## 2023-12-04 MED ORDER — IOHEXOL 350 MG/ML SOLN
80.0000 mL | Freq: Once | INTRAVENOUS | Status: AC | PRN
  Administered 2023-12-04: 80 mL via INTRAVENOUS

## 2023-12-07 ENCOUNTER — Other Ambulatory Visit (HOSPITAL_COMMUNITY): Payer: Self-pay | Admitting: Interventional Radiology

## 2023-12-07 DIAGNOSIS — I771 Stricture of artery: Secondary | ICD-10-CM

## 2023-12-07 DIAGNOSIS — I671 Cerebral aneurysm, nonruptured: Secondary | ICD-10-CM

## 2023-12-18 ENCOUNTER — Ambulatory Visit (HOSPITAL_COMMUNITY)
Admission: RE | Admit: 2023-12-18 | Discharge: 2023-12-18 | Disposition: A | Discharge status: Home / Self Care | Source: Ambulatory Visit | Attending: Interventional Radiology | Admitting: Interventional Radiology

## 2023-12-18 ENCOUNTER — Ambulatory Visit
Admission: RE | Admit: 2023-12-18 | Discharge: 2023-12-18 | Disposition: A | Discharge status: Home / Self Care | Payer: Self-pay | Source: Ambulatory Visit | Attending: Family Medicine | Admitting: Family Medicine

## 2023-12-18 ENCOUNTER — Other Ambulatory Visit (HOSPITAL_COMMUNITY)
Admission: RE | Admit: 2023-12-18 | Discharge: 2023-12-18 | Disposition: A | Discharge status: Home / Self Care | Source: Ambulatory Visit | Attending: Student | Admitting: Student

## 2023-12-18 ENCOUNTER — Other Ambulatory Visit: Payer: Self-pay | Admitting: Student

## 2023-12-18 ENCOUNTER — Other Ambulatory Visit (HOSPITAL_COMMUNITY): Payer: Self-pay | Admitting: Interventional Radiology

## 2023-12-18 DIAGNOSIS — Z87891 Personal history of nicotine dependence: Secondary | ICD-10-CM

## 2023-12-18 DIAGNOSIS — I6523 Occlusion and stenosis of bilateral carotid arteries: Secondary | ICD-10-CM

## 2023-12-18 DIAGNOSIS — I671 Cerebral aneurysm, nonruptured: Secondary | ICD-10-CM | POA: Diagnosis not present

## 2023-12-18 DIAGNOSIS — I6521 Occlusion and stenosis of right carotid artery: Secondary | ICD-10-CM | POA: Diagnosis not present

## 2023-12-18 DIAGNOSIS — I771 Stricture of artery: Secondary | ICD-10-CM | POA: Insufficient documentation

## 2023-12-18 DIAGNOSIS — Z122 Encounter for screening for malignant neoplasm of respiratory organs: Secondary | ICD-10-CM

## 2023-12-18 LAB — PLATELET INHIBITION P2Y12: Platelet Function  P2Y12: 30 [PRU] — ABNORMAL LOW (ref 182–335)

## 2023-12-18 NOTE — Addendum Note (Signed)
 Addended by: Ashley Jacobs on: 12/18/2023 02:48 PM   Modules accepted: Orders

## 2023-12-21 ENCOUNTER — Telehealth: Payer: Self-pay | Admitting: Student

## 2023-12-21 HISTORY — PX: IR RADIOLOGIST EVAL & MGMT: IMG5224

## 2023-12-21 NOTE — Telephone Encounter (Signed)
 P2Y12 value returned at less than 30.  Reviewed with Dr. Corliss Skains who recommended patient to resume Brilinta 45 mg BID dosing for now.   Called and notified patient to continue current medication regimen.   Loyce Dys, MS RD PA-C

## 2024-01-15 ENCOUNTER — Other Ambulatory Visit: Payer: Self-pay | Admitting: Student

## 2024-01-15 DIAGNOSIS — I6523 Occlusion and stenosis of bilateral carotid arteries: Secondary | ICD-10-CM

## 2024-01-15 MED ORDER — TICAGRELOR 90 MG PO TABS: 45.0000 mg | ORAL_TABLET | Freq: Two times a day (BID) | ORAL | 3 refills | Status: AC

## 2024-01-15 NOTE — Telephone Encounter (Signed)
 Patient on Brilinta  45mg  BID per Dr. Alvira Josephs instruction.  Prescription sent to [facility] for Brilinta  90mg  0.5 tablet BID. #30, 3 refills.   Miamor Ayler, MS RD PA-C

## 2024-01-20 ENCOUNTER — Telehealth: Payer: Self-pay | Admitting: *Deleted

## 2024-01-20 DIAGNOSIS — Z122 Encounter for screening for malignant neoplasm of respiratory organs: Secondary | ICD-10-CM

## 2024-01-20 DIAGNOSIS — Z87891 Personal history of nicotine dependence: Secondary | ICD-10-CM

## 2024-01-20 NOTE — Telephone Encounter (Signed)
 Patient called back. We discussed her results with specific mention of ILD. Patient is agreeable to consult with with [facility]. Consult appt made with Dr. Bertrum Brodie on 03/15/2024. Questionnaire has been mailed to patient. Annual LDCT has been ordered. Nothing further needed at this time.    IMPRESSION: 1. Lung-RADS 2, benign appearance or behavior. Continue annual screening with low-dose chest CT without contrast in 12 months. 2. Mild basilar subpleural reticulation and ground-glass in the lateral aspects of both lower lobes, progressive on the right from 12/09/2022. Findings raise concern for early/mild interstitial lung disease. 3. Liver appears steatotic. 4.  Aortic atherosclerosis (ICD10-I70.0). 5.  Emphysema (ICD10-J43.9).     Electronically Signed   By: Shearon Denis M.D.   On: 01/17/2024 15:17

## 2024-01-20 NOTE — Telephone Encounter (Signed)
 Called patient and LVM. Request call back to review results.

## 2024-01-20 NOTE — Telephone Encounter (Signed)
 Per Dara Ear, NP: Call patient and advise she needs appt regarding ILD findings with Dr Waylan Haggard or Dr Bertrum Brodie.

## 2024-03-01 ENCOUNTER — Other Ambulatory Visit: Payer: Self-pay | Admitting: Obstetrics and Gynecology

## 2024-03-01 DIAGNOSIS — Z1231 Encounter for screening mammogram for malignant neoplasm of breast: Secondary | ICD-10-CM

## 2024-03-08 ENCOUNTER — Ambulatory Visit

## 2024-03-09 ENCOUNTER — Ambulatory Visit
Admission: RE | Admit: 2024-03-09 | Discharge: 2024-03-09 | Disposition: A | Discharge status: Home / Self Care | Source: Ambulatory Visit | Attending: Obstetrics and Gynecology | Admitting: Obstetrics and Gynecology

## 2024-03-09 DIAGNOSIS — Z1231 Encounter for screening mammogram for malignant neoplasm of breast: Secondary | ICD-10-CM

## 2024-03-15 ENCOUNTER — Ambulatory Visit (INDEPENDENT_AMBULATORY_CARE_PROVIDER_SITE_OTHER): Admitting: Internal Medicine

## 2024-03-15 ENCOUNTER — Encounter: Payer: Self-pay | Admitting: Internal Medicine

## 2024-03-15 VITALS — BP 108/68 | HR 66 | Ht 61.0 in | Wt 183.4 lb

## 2024-03-15 DIAGNOSIS — Z8616 Personal history of COVID-19: Secondary | ICD-10-CM

## 2024-03-15 DIAGNOSIS — Z87891 Personal history of nicotine dependence: Secondary | ICD-10-CM

## 2024-03-15 DIAGNOSIS — R918 Other nonspecific abnormal finding of lung field: Secondary | ICD-10-CM | POA: Diagnosis not present

## 2024-03-15 NOTE — Patient Instructions (Addendum)
 ICD-10-CM   1. Interstitial lung abnormality (ILA)  R91.8     2. History of 2019 novel coronavirus disease (COVID-19)  Z86.16     3. Stopped smoking with greater than 40 pack year history  Z87.891       - To me this looks like interstitial lung abnormality that started in 2021 and has remained stable through 2025 March - This was not present in 2016 or in 2020 - Interstitial lung abnormality is extremely mild abnormal changes in the patient actually feels normal and everything about the patient is otherwise normal in the level of abnormalities do not meet the threshold for disease  - Some of these patients do get worse over the period of time and therefore require some effort towards prognostication and definite monitoring  - In your particular instance this could just be post-COVID scar  which typically remains stable  OR could be a precursor to disease such as NSIP or IPF  PLAN  - take ILD questionnaire packet with you and bring it back NEXT TIME - do FULL PFT  - - do following blood work - autoimmune panel: Serum: ESR, ANA, DS-DNA, RF, anti-CCP, ssA, ssB, scl-70, Total CK,  Aldolase,   - do serum Hypersensitivity Pneumonitis Panel   - do blood  Quantiferon Gold   Do sit stand test    - At follow-up we will decide timing of the high-resolution CT chest which likely will happen between September 2025 and March 2026    - Holding off on high-resolution CT chest till this time point to avoid radiation risk    - Ideally will make the next high-resolution CT chest replaced your lung cancer screening CT chest  FOllowup  =- 15 min visit in 2-3 months with Dr Geronimo  - Do a sit/stand test and also symptom score test at follow-up

## 2024-03-15 NOTE — Progress Notes (Signed)
 OV 03/15/2024  Subjective:  Patient ID: Whitney [name], female , DOB: Sep 09, 1961 , age 63 y.o. , MRN: [id number] , ADDRESS: [address] [place] [id number] PCP Teresa Channel, MD Patient Care Team: Teresa Channel, MD as PCP - General (Family Medicine)  This Provider for this visit: Treatment Team:  Attending Provider: Geronimo Amel, MD    03/15/2024 -   Chief Complaint  Patient presents with   Pulmonary Consult    Referred by Dr. Channel Teresa- possible ILD of LDCT 12/18/23. She denies any respiratory co's.      HPI Whitney [name] 63 y.o. -former Chemical engineer record journalist, con Visual merchandiser.  40 pack former smoker quit 10 years ago and entered into low-dose CT scan lung cancer screening program.  She says that at baseline she is asymptomatic.  She denies any cough.  Denies shortness of breath.  No chest pain no orthopnea no paroxysmal nocturnal dyspnea no wheezing no edema no fevers no chills.  Not using inhalers.  Feels great.  In the last 1 year no hospitalizations no surgeries or emergency room visits.  She has been having diligent compliant annual low-dose CT scan since 2016.  She says this year interstitial lung disease popped up.  In own words it was pulmonary fibrosis and therefore she has been referred.  She is puzzled by this.  She does report a history of COVID x 2 treated as outpatient perhaps 4 to 5 years ago in the early part of the pandemic  Personally visualized all the CT scans CT scan of the chest low-dose in 2016 lung parenchyma looks clear.  Also looks clear in 2019 and 2020.  Then starting 2021 CT scan looks changed.  However in my personal visualization opinion these interstitial findings have remained stable since 20 21-20 24 and also the latest one in March 2025.  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  This because it is incidental finding, she is asymptomatic and also has an nondependent portion.   I personally believe it is in the lateral portions.  There is reticulation.  And there is involving less than 5% of the lung zone.  This is new May 2025 ATS definition.      SIT STAND TEST - goal 15 times   03/15/2024    O2 used ra   PRobe - finter or forehead finger   Number sit and stand completed - goal 15 15   Time taken to complete 39 sec   Resting Pulse Ox/HR/Dyspnea  97% and 66/min and dyspnea of 1/10    Peak measures 95 % and 99/min and dyspnea of 2/10   Final Pulse Ox/HR 96% and 78/min and dyspnea of 1/10   Desaturated </= 88% no   Desaturated <= 3% points no   Got Tachycardic >/= 90/min yes   Miscellaneous comments x        Narrative & Impression  CLINICAL DATA:  Forty pack-year smoking history.   EXAM: CT CHEST WITHOUT CONTRAST LOW-DOSE FOR LUNG CANCER SCREENING   TECHNIQUE: Multidetector CT imaging of the chest was performed following the standard protocol without IV contrast.   RADIATION DOSE REDUCTION: This exam was performed according to the departmental dose-optimization program which includes automated exposure control, adjustment of the mA and/or kV according to patient size and/or use of iterative reconstruction technique.   COMPARISON:  12/09/2022.   FINDINGS: Cardiovascular: Atherosclerotic calcification of the aorta. Heart is at  the upper limits of normal in size to mildly enlarged. No pericardial effusion.   Mediastinum/Nodes: No pathologically enlarged mediastinal or axillary lymph nodes. Hilar regions are difficult to definitively evaluate without IV contrast. Esophagus is grossly unremarkable.   Lungs/Pleura: Centrilobular emphysema. Pulmonary nodules measure 4.1 mm or less in size, as before. Mild basilar subpleural reticulation and ground-glass in the lateral aspects of both lower lobes, progressive on the right from 12/09/2022. No pleural fluid. Airway is unremarkable.   Upper Abdomen: Liver is mildly heterogeneous in  attenuation. Visualized portions of the liver, gallbladder, adrenal glands, kidneys, spleen, pancreas, stomach and bowel are otherwise grossly unremarkable. No upper abdominal adenopathy.   Musculoskeletal: Degenerative changes in the spine.   IMPRESSION: 1. Lung-RADS 2, benign appearance or behavior. Continue annual screening with low-dose chest CT without contrast in 12 months. 2. Mild basilar subpleural reticulation and ground-glass in the lateral aspects of both lower lobes, progressive on the right from 12/09/2022. Findings raise concern for early/mild interstitial lung disease. 3. Liver appears steatotic. 4.  Aortic atherosclerosis (ICD10-I70.0). 5.  Emphysema (ICD10-J43.9).     Electronically Signed   By: Newell Eke M.D.   On: 01/17/2024 15:17    PFT      No data to display             LAB RESULTS last 96 hours No results found.       has a past medical history of Aortic atherosclerosis (HCC), Cancer (HCC), Depression, Dyslipidemia, and Hypertension.   reports that she has quit smoking. Her smoking use included cigarettes. She started smoking about 40 years ago. She has a 40 pack-year smoking history. She has been exposed to tobacco smoke. She quit smokeless tobacco use about 9 years ago.  Past Surgical History:  Procedure Laterality Date   BILATERAL SALPINGOOPHORECTOMY  2010   with hyst   BREAST LUMPECTOMY WITH AXILLARY LYMPH NODE BIOPSY  2001   right   BREAST RECONSTRUCTION  2001   right trans flap with mastectomy   BREAST RECONSTRUCTION  2002   revision rt flap   CYST EXCISION  2004   rt axilla   DILATION AND CURETTAGE OF UTERUS     HAND EXPLORATION     age 55-injury-repair trama-rt   IR ANGIO INTRA EXTRACRAN SEL COM CAROTID INNOMINATE BILAT MOD SED  04/17/2023   IR ANGIO VERTEBRAL SEL SUBCLAVIAN INNOMINATE UNI L MOD SED  04/17/2023   IR ANGIO VERTEBRAL SEL VERTEBRAL UNI R MOD SED  04/17/2023   IR RADIOLOGIST EVAL & MGMT  04/22/2023   IR  RADIOLOGIST EVAL & MGMT  12/21/2023   LAPAROSCOPIC VAGINAL HYSTERECTOMY  2010   LIGAMENT REPAIR Right 11/02/2013   Procedure: REPAIR RADIAL COLLATERAL LIGAMENT METACARPAL PHALANGEAL RIGHT THUMB;  Surgeon: Arley JONELLE Curia, MD;  Location: [facility];  Service: Orthopedics;  Laterality: Right;   MASTECTOMY  2001   right-followed by reconstruction   RECONSTRUCTION / CORRECTION OF NIPPLE / AEROLA  2003    No Known Allergies   There is no immunization history on file for this patient.  Family History  Problem Relation Age of Onset   Atrial fibrillation Mother    Prostate cancer Father      Current Outpatient Medications:    aspirin EC 81 MG tablet, Take 81 mg by mouth daily. Swallow whole., Disp: , Rfl:    AUVELITY 45-105 MG TBCR, Take 1 tablet by mouth 2 (two) times daily., Disp: , Rfl:    Calcium Citrate-Vitamin D  315-5 MG-MCG TABS, Take by mouth., Disp: , Rfl:    CRESTOR 20 MG tablet, Take 20 mg by mouth daily., Disp: , Rfl:    cyanocobalamin 50 MCG tablet, Take 1 tablet by mouth daily., Disp: , Rfl:    ticagrelor  (BRILINTA ) 90 MG TABS tablet, Take 0.5 tablets (45 mg total) by mouth 2 (two) times daily., Disp: 30 tablet, Rfl: 3   valsartan (DIOVAN) 320 MG tablet, Take 320 mg by mouth daily., Disp: , Rfl:    desvenlafaxine (PRISTIQ) 100 MG 24 hr tablet, Take 1 tablet by mouth daily., Disp: , Rfl:    FLUoxetine (PROZAC) 40 MG capsule, Take 40 mg by mouth daily. (Patient not taking: Reported on 07/30/2023), Disp: , Rfl:    HYDROcodone -acetaminophen  (NORCO) 5-325 MG per tablet, Take 1 tablet by mouth every 6 (six) hours as needed for moderate pain. (Patient not taking: Reported on 07/30/2023), Disp: 30 tablet, Rfl: 0   Multiple Vitamins-Minerals (MULTIVITAMIN WITH MINERALS) tablet, Take 1 tablet by mouth daily. (Patient not taking: Reported on 03/15/2024), Disp: , Rfl:    VRAYLAR 1.5 MG capsule, Take 1.5 mg by mouth daily., Disp: , Rfl:       Objective:   Vitals:   03/15/24  1005 03/15/24 1006  BP:  108/68  Pulse: 66   SpO2: 97%   Weight:  183 lb 6.4 oz (83.2 kg)  Height:  5' 1 (1.549 m)    Estimated body mass index is 34.65 kg/m as calculated from the following:   Height as of this encounter: 5' 1 (1.549 m).   Weight as of this encounter: 183 lb 6.4 oz (83.2 kg).  @WEIGHTCHANGE @  [employer]   03/15/24 1006  Weight: 183 lb 6.4 oz (83.2 kg)     Physical Exam   General: No distress. Looks well O2 at rest: no Cane present: no Sitting in wheel chair: no Frail: no Obese: no Neuro: Alert and Oriented x 3. GCS 15. Speech normal Psych: Pleasant Resp:  Barrel Chest - no.  Wheeze - no, Crackles - no, No overt respiratory distress CVS: Normal heart sounds. Murmurs - no Ext: Stigmata of Connective Tissue Disease - no HEENT: Normal upper airway. PEERL +. No post nasal drip        Assessment:       ICD-10-CM   1. Interstitial lung abnormality (ILA)  R91.8 Pulmonary function test    Antinuclear Antib (ANA)    Rheumatoid factor    Cyclic citrul peptide antibody, IgG    Anti-DNA antibody, double-stranded    Sjogrens syndrome-A extractable nuclear antibody    Sjogrens syndrome-B extractable nuclear antibody    Anti-scleroderma antibody    QuantiFERON-TB Gold Plus    QuantiFERON-TB Gold Plus    Pulmonary function test    Antinuclear Antib (ANA)    Rheumatoid factor    Cyclic citrul peptide antibody, IgG    Anti-DNA antibody, double-stranded    Sjogrens syndrome-A extractable nuclear antibody    Anti-scleroderma antibody    2. History of 2019 novel coronavirus disease (COVID-19)  Z86.16 Pulmonary function test    Antinuclear Antib (ANA)    Rheumatoid factor    Cyclic citrul peptide antibody, IgG    Anti-DNA antibody, double-stranded    Sjogrens syndrome-A extractable nuclear antibody    Sjogrens syndrome-B extractable nuclear antibody    Anti-scleroderma antibody    QuantiFERON-TB Gold Plus    QuantiFERON-TB Gold Plus    Pulmonary  function test    Antinuclear Antib (ANA)  Rheumatoid factor    Cyclic citrul peptide antibody, IgG    Anti-DNA antibody, double-stranded    Sjogrens syndrome-A extractable nuclear antibody    Anti-scleroderma antibody    3. Stopped smoking with greater than 40 pack year history  Z87.891 Pulmonary function test    Antinuclear Antib (ANA)    Rheumatoid factor    Cyclic citrul peptide antibody, IgG    Anti-DNA antibody, double-stranded    Sjogrens syndrome-A extractable nuclear antibody    Sjogrens syndrome-B extractable nuclear antibody    Anti-scleroderma antibody    QuantiFERON-TB Gold Plus    QuantiFERON-TB Gold Plus    Pulmonary function test    Antinuclear Antib (ANA)    Rheumatoid factor    Cyclic citrul peptide antibody, IgG    Anti-DNA antibody, double-stranded    Sjogrens syndrome-A extractable nuclear antibody    Anti-scleroderma antibody         Plan:     Patient Instructions     ICD-10-CM   1. Interstitial lung abnormality (ILA)  R91.8     2. History of 2019 novel coronavirus disease (COVID-19)  Z86.16     3. Stopped smoking with greater than 40 pack year history  Z87.891       - To me this looks like interstitial lung abnormality that started in 2021 and has remained stable through 2025 March - This was not present in 2016 or in 2020 - Interstitial lung abnormality is extremely mild abnormal changes in the patient actually feels normal and everything about the patient is otherwise normal in the level of abnormalities do not meet the threshold for disease  - Some of these patients do get worse over the period of time and therefore require some effort towards prognostication and definite monitoring  - In your particular instance this could just be post-COVID scar  which typically remains stable  OR could be a precursor to disease such as NSIP or IPF  PLAN  - take ILD questionnaire packet with you and bring it back NEXT TIME - do FULL PFT  - - do following  blood work - autoimmune panel: Serum: ESR, ANA, DS-DNA, RF, anti-CCP, ssA, ssB, scl-70, Total CK,  Aldolase,   - do serum Hypersensitivity Pneumonitis Panel   - do blood  Quantiferon Gold   Do sit stand test    - At follow-up we will decide timing of the high-resolution CT chest which likely will happen between September 2025 and March 2026    - Holding off on high-resolution CT chest till this time point to avoid radiation risk    - Ideally will make the next high-resolution CT chest replaced your lung cancer screening CT chest  FOllowup  =- 15 min visit in 2-3 months with Dr Geronimo  - Do a sit/stand test and also symptom score test at follow-up    FOLLOWUP Return in about 10 weeks (around 05/24/2024) for 15 min visit, with Dr Geronimo, Face to Face Visit.    SIGNATURE    Dr. Dorethia Geronimo, M.D., F.C.C.P,  Pulmonary and Critical Care Medicine Staff Physician, [facility] Center Director - Interstitial Lung Disease  Program  Pulmonary Fibrosis [facility] [place], [place], [address]  Pager: [phone], If no answer or between  15:00h - 7:00h: call [phone] Telephone: [phone]  2:41 PM 03/15/2024

## 2024-03-17 ENCOUNTER — Ambulatory Visit: Payer: Self-pay | Admitting: Internal Medicine

## 2024-03-17 LAB — ANTI-NUCLEAR AB-TITER (ANA TITER)
ANA TITER: 1:40 {titer} — ABNORMAL HIGH
ANA Titer 1: 1:80 {titer} — ABNORMAL HIGH

## 2024-03-17 LAB — QUANTIFERON-TB GOLD PLUS
Mitogen-NIL: 9.14 [IU]/mL
NIL: 0.01 [IU]/mL
QuantiFERON-TB Gold Plus: NEGATIVE
TB1-NIL: 0 [IU]/mL
TB2-NIL: 0 [IU]/mL

## 2024-03-17 LAB — ANA: Anti Nuclear Antibody (ANA): POSITIVE — AB

## 2024-03-17 LAB — RHEUMATOID FACTOR: Rheumatoid fact SerPl-aCnc: <10 [IU]/mL (ref <14)

## 2024-03-17 LAB — SJOGRENS SYNDROME-A EXTRACTABLE NUCLEAR ANTIBODY: SSA (Ro) (ENA) Antibody, IgG: <1 AI

## 2024-03-17 LAB — ANTI-DNA ANTIBODY, DOUBLE-STRANDED: ds DNA Ab: <1 [IU]/mL

## 2024-03-17 LAB — ANTI-SCLERODERMA ANTIBODY: Scleroderma (Scl-70) (ENA) Antibody, IgG: <1 AI

## 2024-03-17 LAB — CYCLIC CITRUL PEPTIDE ANTIBODY, IGG: Cyclic Citrullin Peptide Ab: <16 U

## 2024-03-17 LAB — SJOGRENS SYNDROME-B EXTRACTABLE NUCLEAR ANTIBODY: SSB (La) (ENA) Antibody, IgG: <1 AI

## 2024-03-18 ENCOUNTER — Encounter (HOSPITAL_COMMUNITY): Payer: Self-pay | Admitting: Interventional Radiology

## 2024-03-18 NOTE — Telephone Encounter (Signed)
Responded to the pt via mychart  

## 2024-03-18 NOTE — Telephone Encounter (Signed)
 These antibdoes are LOW TITREST and likely are part of normal ageing and rpevalence.  You should enjoy the beach There is notthing urgent here When yo ufill out ILD form and at followup we can discuss  On other hand,- if you hare having rash on cheek or unclers in mouth or red urine or blue fingers (none noticed elicited in clinic visit) -- you can call back     Latest Reference Range & Units 03/15/24 11:06  Anti Nuclear Antibody (ANA) NEGATIVE  POSITIVE !  ANA Pattern 1  Nuclear, Homogeneous !  ANA Titer 1 titer 1:80 (H)  Cyclic Citrullin Peptide Ab UNITS <16  ds DNA Ab IU/mL <1  RA Latex Turbid. <14 IU/mL <10  SSA (Ro) (ENA) Antibody, IgG <1.0 NEG AI <1.0 NEG  SSB (La) (ENA) Antibody, IgG <1.0 NEG AI <1.0 NEG  Scleroderma (Scl-70) (ENA) Antibody, IgG <1.0 NEG AI <1.0 NEG  QuantiFERON-TB Gold Plus  Rpt  Mitogen-NIL IU/mL 9.14  ANA PATTERN  Cytoplasmic !  ANA TITER titer 1:40 (H)  NIL IU/mL 0.01  TB1-NIL IU/mL 0.00  TB2-NIL IU/mL 0.00  !: Data is abnormal (H): Data is abnormally high Rpt: View report in Results Review for more information

## 2024-07-03 NOTE — Progress Notes (Unsigned)
 OV 03/15/2024  Subjective:  Patient ID: Whitney [name], female , DOB: Feb 23, 1961 , age 63 y.o. , MRN: [id number] , ADDRESS: [address] [place] [address] PCP Teresa Channel, MD Patient Care Team: Teresa Channel, MD as PCP - General (Family Medicine)  This Provider for this visit: Treatment Team:  Attending Provider: Geronimo Amel, MD    03/15/2024 -   Chief Complaint  Patient presents with   Pulmonary Consult    Referred by Dr. Channel Teresa- possible ILD of LDCT 12/18/23. She denies any respiratory co's.      HPI Whitney [name] 63 y.o. -former Chemical engineer record journalist, con Visual merchandiser.  40 pack former smoker quit 10 years ago and entered into low-dose CT scan lung cancer screening program.  She says that at baseline she is asymptomatic.  She denies any cough.  Denies shortness of breath.  No chest pain no orthopnea no paroxysmal nocturnal dyspnea no wheezing no edema no fevers no chills.  Not using inhalers.  Feels great.  In the last 1 year no hospitalizations no surgeries or emergency room visits.  She has been having diligent compliant annual low-dose CT scan since 2016.  She says this year interstitial lung disease popped up.  In own words it was pulmonary fibrosis and therefore she has been referred.  She is puzzled by this.  She does report a history of COVID x 2 treated as outpatient perhaps 4 to 5 years ago in the early part of the pandemic  Personally visualized all the CT scans CT scan of the chest low-dose in 2016 lung parenchyma looks clear.  Also looks clear in 2019 and 2020.  Then starting 2021 CT scan looks changed.  However in my personal visualization opinion these interstitial findings have remained stable since 20 21-20 24 and also the latest one in March 2025.  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  This because it is incidental finding, she is asymptomatic and also has an nondependent portion.   I personally believe it is in the lateral portions.  There is reticulation.  And there is involving less than 5% of the lung zone.  This is new May 2025 ATS definition.       Narrative & Impression  CLINICAL DATA:  Forty pack-year smoking history.   EXAM: CT CHEST WITHOUT CONTRAST LOW-DOSE FOR LUNG CANCER SCREENING   TECHNIQUE: Multidetector CT imaging of the chest was performed following the standard protocol without IV contrast.   RADIATION DOSE REDUCTION: This exam was performed according to the departmental dose-optimization program which includes automated exposure control, adjustment of the mA and/or kV according to patient size and/or use of iterative reconstruction technique.   COMPARISON:  12/09/2022.   FINDINGS: Cardiovascular: Atherosclerotic calcification of the aorta. Heart is at the upper limits of normal in size to mildly enlarged. No pericardial effusion.   Mediastinum/Nodes: No pathologically enlarged mediastinal or axillary lymph nodes. Hilar regions are difficult to definitively evaluate without IV contrast. Esophagus is grossly unremarkable.   Lungs/Pleura: Centrilobular emphysema. Pulmonary nodules measure 4.1 mm or less in size, as before. Mild basilar subpleural reticulation and ground-glass in the lateral aspects of both lower lobes, progressive on the right from 12/09/2022. No pleural fluid. Airway is unremarkable.   Upper Abdomen: Liver is mildly heterogeneous in attenuation. Visualized portions of the liver, gallbladder, adrenal glands, kidneys, spleen, pancreas, stomach and bowel are otherwise grossly unremarkable. No upper abdominal adenopathy.  Musculoskeletal: Degenerative changes in the spine.   IMPRESSION: 1. Lung-RADS 2, benign appearance or behavior. Continue annual screening with low-dose chest CT without contrast in 12 months. 2. Mild basilar subpleural reticulation and ground-glass in the lateral aspects of both lower lobes,  progressive on the right from 12/09/2022. Findings raise concern for early/mild interstitial lung disease. 3. Liver appears steatotic. 4.  Aortic atherosclerosis (ICD10-I70.0). 5.  Emphysema (ICD10-J43.9).     Electronically Signed   By: Newell Eke M.D.   On: 01/17/2024 15:17     OV 07/04/2024  Subjective:  Patient ID: Whitney [name], female , DOB: 30-Jun-1961 , age 63 y.o. , MRN: [id number] , ADDRESS: [address] [place] [address] PCP Teresa Channel, MD Patient Care Team: Teresa Channel, MD as PCP - General (Family Medicine)  This Provider for this visit: Treatment Team:  Attending Provider: Geronimo Amel, MD    07/04/2024 -   Chief Complaint  Patient presents with   interstital lung abnormality    Pt stated since LOV breathing has been fine    Follow-up interstitial lung abnormalities   HPI Whitney [name] 63 y.o. -presents for follow-up.  Since her last visit Interim Health status: No new complaints No new medical problems. No new surgeries. No ER visits. No Urgent care visits. No changes to medications she completed the interstitial lung disease questionnaire and documented below.  Last visit when we did serology ANA was trace positive and she is worried about this but she denies any Raynaud's any oral ulcers any malar rash   [facility] Comprehensive ILD Questionnaire  Symptoms:   SYMPTOM SCALE - ILD 07/04/2024  Current weight   O2 use ra  Shortness of Breath 0 -> 5 scale with 5 being worst (score 6 If unable to do)  At rest 0  Simple tasks - showers, clothes change, eating, shaving 0  Household (dishes, doing bed, laundry) 0  Shopping 0  Walking level at own pace 0  Walking up Stairs 1  Total (30-36) Dyspnea Score 1      Non-dyspnea symptoms (0-> 5 scale) 07/04/2024  How bad is your cough? 0  How bad is your fatigue 0  How bad is nausea 0  How bad is vomiting?  0  How bad is diarrhea? 0  How bad is  anxiety? 0  How bad is depression 0  Any chronic pain - if so where and how bad 0     Past Medical History :  Positive for COVID-vaccine and also COVID disease in 2019 and 2020 Has hypertension Has hyperlipidemia Has stenosis of the internal carotid artery and 3 small aneurysms.  She has bilateral carotid artery stenosis. Participate in lung cancer screening program Obesity but intentionally losing weight  ROS:  27 pound weight loss in the last few years because of exercise program She reports that witnesses have reported snoring but no formal diagnosis of sleep apnea  FAMILY HISTORY of LUNG DISEASE:  Her father had premature graying at the age of 2.  She herself has silver hair.  However there is no interstitial lung disease or any other lung disease in the family  PERSONAL EXPOSURE HISTORY:  She smoked between 1973 and 2015 1 pack/day.  She is on lung cancer screening program  HOME  EXPOSURE and HOBBY DETAILS :  Lives in a townhome for the last 5 years it was built in 1970.  Outside of that she lived in the suburban single-family home built  in [place].  Detail organic antigen exposure history and either of these forms is negative  OCCUPATIONAL HISTORY (122 questions) : She is a Gaffer retired.  Worked for the [employer] She does gardening but outside of that detail organic and inorganic antigen exposure history is negative Now worked in a dusty environment  PULMONARY TOXICITY HISTORY (27 items):  No drug-related causes of ILD including drugs that cause eosinophilia  INVESTIGATIONS: x    SIT STAND TEST - goal 15 times   03/15/2024    O2 used ra   PRobe - finter or forehead finger   Number sit and stand completed - goal 15 15   Time taken to complete 39 sec   Resting Pulse Ox/HR/Dyspnea  97% and 66/min and dyspnea of 1/10    Peak measures 95 % and 99/min and dyspnea of 2/10   Final Pulse Ox/HR 96% and 78/min and dyspnea of 1/10   Desaturated </= 88% no    Desaturated <= 3% points no   Got Tachycardic >/= 90/min yes   Miscellaneous comments x      Latest Reference Range & Units 03/15/24 11:06  Anti Nuclear Antibody (ANA) NEGATIVE  POSITIVE !  ANA Pattern 1  Nuclear, Homogeneous !  ANA Titer 1 titer 1:80 (H)  Cyclic Citrullin Peptide Ab UNITS <16  ds DNA Ab IU/mL <1  RA Latex Turbid. <14 IU/mL <10  SSA (Ro) (ENA) Antibody, IgG <1.0 NEG AI <1.0 NEG  SSB (La) (ENA) Antibody, IgG <1.0 NEG AI <1.0 NEG  Scleroderma (Scl-70) (ENA) Antibody, IgG <1.0 NEG AI <1.0 NEG  QuantiFERON-TB Gold Plus  Rpt  Mitogen-NIL IU/mL 9.14  !: Data is abnormal (H): Data is abnormally high Rpt: View report in Results Review for more information    LAB RESULTS last 96 hours No results found.       has a past medical history of Aortic atherosclerosis, Cancer (HCC), Depression, Dyslipidemia, and Hypertension.   reports that she has quit smoking. Her smoking use included cigarettes. She started smoking about 40 years ago. She has a 40.3 pack-year smoking history. She has been exposed to tobacco smoke. She quit smokeless tobacco use about 9 years ago.  Past Surgical History:  Procedure Laterality Date   BILATERAL SALPINGOOPHORECTOMY  2010   with hyst   BREAST LUMPECTOMY WITH AXILLARY LYMPH NODE BIOPSY  2001   right   BREAST RECONSTRUCTION  2001   right trans flap with mastectomy   BREAST RECONSTRUCTION  2002   revision rt flap   CYST EXCISION  2004   rt axilla   DILATION AND CURETTAGE OF UTERUS     HAND EXPLORATION     age 45-injury-repair trama-rt   IR ANGIO INTRA EXTRACRAN SEL COM CAROTID INNOMINATE BILAT MOD SED  04/17/2023   IR ANGIO VERTEBRAL SEL SUBCLAVIAN INNOMINATE UNI L MOD SED  04/17/2023   IR ANGIO VERTEBRAL SEL VERTEBRAL UNI R MOD SED  04/17/2023   IR RADIOLOGIST EVAL & MGMT  04/22/2023   IR RADIOLOGIST EVAL & MGMT  12/21/2023   LAPAROSCOPIC VAGINAL HYSTERECTOMY  2010   LIGAMENT REPAIR Right 11/02/2013   Procedure: REPAIR RADIAL COLLATERAL  LIGAMENT METACARPAL PHALANGEAL RIGHT THUMB;  Surgeon: Arley JONELLE Curia, MD;  Location: [facility];  Service: Orthopedics;  Laterality: Right;   MASTECTOMY  2001   right-followed by reconstruction   RECONSTRUCTION / CORRECTION OF NIPPLE / AEROLA  2003    No Known Allergies  Immunization History  Administered Date(s) Administered  INFLUENZA, HIGH DOSE SEASONAL PF 07/04/2024    Family History  Problem Relation Age of Onset   Atrial fibrillation Mother    Prostate cancer Father      Current Outpatient Medications:    aspirin EC 81 MG tablet, Take 81 mg by mouth daily. Swallow whole., Disp: , Rfl:    AUVELITY 45-105 MG TBCR, Take 1 tablet by mouth 2 (two) times daily., Disp: , Rfl:    Calcium Citrate-Vitamin D 315-5 MG-MCG TABS, Take by mouth., Disp: , Rfl:    Multiple Vitamins-Minerals (MULTIVITAMIN WITH MINERALS) tablet, Take 1 tablet by mouth daily., Disp: , Rfl:    rosuvastatin (CRESTOR) 40 MG tablet, Take 40 mg by mouth daily., Disp: , Rfl:    valsartan (DIOVAN) 320 MG tablet, Take 320 mg by mouth daily., Disp: , Rfl:    CRESTOR 20 MG tablet, Take 20 mg by mouth daily. (Patient not taking: Reported on 07/04/2024), Disp: , Rfl:    cyanocobalamin 50 MCG tablet, Take 1 tablet by mouth daily. (Patient not taking: Reported on 07/04/2024), Disp: , Rfl:    desvenlafaxine (PRISTIQ) 100 MG 24 hr tablet, Take 1 tablet by mouth daily. (Patient not taking: Reported on 07/04/2024), Disp: , Rfl:    FLUoxetine (PROZAC) 40 MG capsule, Take 40 mg by mouth daily. (Patient not taking: Reported on 07/04/2024), Disp: , Rfl:    HYDROcodone -acetaminophen  (NORCO) 5-325 MG per tablet, Take 1 tablet by mouth every 6 (six) hours as needed for moderate pain. (Patient not taking: Reported on 07/04/2024), Disp: 30 tablet, Rfl: 0   VRAYLAR 1.5 MG capsule, Take 1.5 mg by mouth daily. (Patient not taking: Reported on 07/04/2024), Disp: , Rfl:       Objective:   Vitals:   07/04/24 0922  BP: 124/68   Pulse: 66  Temp: 99 F (37.2 C)  TempSrc: Oral  SpO2: 96%  Weight: 178 lb 9.6 oz (81 kg)  Height: 5' 1 (1.549 m)    Estimated body mass index is 33.75 kg/m as calculated from the following:   Height as of this encounter: 5' 1 (1.549 m).   Weight as of this encounter: 178 lb 9.6 oz (81 kg).  @WEIGHTCHANGE @  Filed Weights   07/04/24 0922  Weight: 178 lb 9.6 oz (81 kg)     Physical Exam   General: No distress. Looks well O2 at rest: no Cane present: no Sitting in wheel chair: no Frail: no Obese: no Neuro: Alert and Oriented x 3. GCS 15. Speech normal Psych: Pleasant Resp:  Barrel Chest - no.  Wheeze - no, Crackles - no, No overt respiratory distress CVS: Normal heart sounds. Murmurs - o Ext: Stigmata of Connective Tissue Disease - no HEENT: Normal upper airway. PEERL +. No post nasal drip        Assessment/     Assessment & Plan Interstitial lung abnormality (ILA)  ANA positive  Stopped smoking with greater than 40 pack year history  History of 2019 novel coronavirus disease (COVID-19)  Screening for lung cancer  Flu vaccine need    PLAN Patient Instructions     ICD-10-CM   1. Interstitial lung abnormality (ILA)  R91.8 CT Chest High Resolution    Pulmonary function test    2. ANA positive  R76.89 CT Chest High Resolution    Pulmonary function test    3. Stopped smoking with greater than 40 pack year history  Z87.891 CT Chest High Resolution    Pulmonary function test    4.  History of 2019 novel coronavirus disease (COVID-19)  Z86.16 CT Chest High Resolution    Pulmonary function test    5. Screening for lung cancer  Z12.2 CT Chest High Resolution    Pulmonary function test    6. Flu vaccine need  Z23 CT Chest High Resolution    Pulmonary function test      Interstitial lung abnormality (ILA) -  Hx of covid Hx of smoking  Plan:  In March /April 2026 do  - CT Chest High Resolution, Pulmonary function test  ANA positive    Likely not clinically significant  Plan  - suppprtive care - monitoring  Stopped smoking with greater than 40 pack year history  Screening for lung cancer   Plan  - cancel LDCT  - capture information in HRCT in spring 2026  Flu vaccine need  [facility] counseling  - Plan:  - High dose flu shot 07/04/2024 - covid and RSV Vaccine on  your own -Please talk to PCP Teresa Channel, MD -  and ensure you get  shingrix (GSK) inactivated vaccine against shingles     FOLLOWUP    Return for march 2026 , 15 min visit after HRCT.    SIGNATURE    Dr. Dorethia Cave, M.D., F.C.C.P,  Pulmonary and Critical Care Medicine Staff Physician, [facility] Director - Interstitial Lung Disease  Program  Pulmonary Fibrosis [facility] at [facility] [place], [place], [address]  Pager: [phone], If no answer or between  15:00h - 7:00h: call [phone] Telephone: [phone]  5:25 PM 07/04/2024

## 2024-07-03 NOTE — Patient Instructions (Signed)
 ICD-10-CM   1. Interstitial lung abnormality (ILA)  R91.8     2. History of 2019 novel coronavirus disease (COVID-19)  Z86.16     3. Stopped smoking with greater than 40 pack year history  Z87.891       - To me this looks like interstitial lung abnormality that started in 2021 and has remained stable through 2025 March - This was not present in 2016 or in 2020 - Interstitial lung abnormality is extremely mild abnormal changes in the patient actually feels normal and everything about the patient is otherwise normal in the level of abnormalities do not meet the threshold for disease  - Some of these patients do get worse over the period of time and therefore require some effort towards prognostication and definite monitoring  - In your particular instance this could just be post-COVID scar  which typically remains stable  OR could be a precursor to disease such as NSIP or IPF  PLAN  - take ILD questionnaire packet with you and bring it back NEXT TIME - do FULL PFT  - - do following blood work - autoimmune panel: Serum: ESR, ANA, DS-DNA, RF, anti-CCP, ssA, ssB, scl-70, Total CK,  Aldolase,   - do serum Hypersensitivity Pneumonitis Panel   - do blood  Quantiferon Gold   Do sit stand test    - At follow-up we will decide timing of the high-resolution CT chest which likely will happen between September 2025 and March 2026    - Holding off on high-resolution CT chest till this time point to avoid radiation risk    - Ideally will make the next high-resolution CT chest replaced your lung cancer screening CT chest  FOllowup  =- 15 min visit in 2-3 months with Dr Geronimo  - Do a sit/stand test and also symptom score test at follow-up

## 2024-07-04 ENCOUNTER — Ambulatory Visit: Admitting: Internal Medicine

## 2024-07-04 ENCOUNTER — Telehealth: Payer: Self-pay | Admitting: Internal Medicine

## 2024-07-04 ENCOUNTER — Encounter: Payer: Self-pay | Admitting: Internal Medicine

## 2024-07-04 VITALS — BP 124/68 | HR 66 | Temp 99.0°F | Ht 61.0 in | Wt 178.6 lb

## 2024-07-04 DIAGNOSIS — Z23 Encounter for immunization: Secondary | ICD-10-CM

## 2024-07-04 DIAGNOSIS — Z122 Encounter for screening for malignant neoplasm of respiratory organs: Secondary | ICD-10-CM

## 2024-07-04 DIAGNOSIS — R7689 Other specified abnormal immunological findings in serum: Secondary | ICD-10-CM

## 2024-07-04 DIAGNOSIS — R918 Other nonspecific abnormal finding of lung field: Secondary | ICD-10-CM | POA: Diagnosis not present

## 2024-07-04 DIAGNOSIS — Z8616 Personal history of COVID-19: Secondary | ICD-10-CM

## 2024-07-04 DIAGNOSIS — Z87891 Personal history of nicotine dependence: Secondary | ICD-10-CM | POA: Diagnosis not present

## 2024-07-04 NOTE — Telephone Encounter (Signed)
 Camie   I am getting a HRCT instead of LDCT in spring 2026. Just FYI.  Thanks  MR

## 2024-07-04 NOTE — Telephone Encounter (Signed)
 I will cancel order for LDCT and will place a reminder to follow HRCT results once available to determine when lung screening follow up will be due.

## 2024-07-28 ENCOUNTER — Ambulatory Visit (HOSPITAL_COMMUNITY): Payer: BC Managed Care – PPO

## 2024-10-11 ENCOUNTER — Encounter: Payer: Self-pay | Admitting: Internal Medicine

## 2024-10-11 DIAGNOSIS — R918 Other nonspecific abnormal finding of lung field: Secondary | ICD-10-CM

## 2024-10-11 DIAGNOSIS — Z23 Encounter for immunization: Secondary | ICD-10-CM

## 2024-10-11 DIAGNOSIS — R7689 Other specified abnormal immunological findings in serum: Secondary | ICD-10-CM

## 2024-10-11 DIAGNOSIS — Z122 Encounter for screening for malignant neoplasm of respiratory organs: Secondary | ICD-10-CM

## 2024-10-11 DIAGNOSIS — Z8616 Personal history of COVID-19: Secondary | ICD-10-CM

## 2024-10-11 DIAGNOSIS — Z87891 Personal history of nicotine dependence: Secondary | ICD-10-CM

## 2024-12-05 ENCOUNTER — Encounter

## 2024-12-06 ENCOUNTER — Ambulatory Visit: Admitting: Internal Medicine
